# Patient Record
Sex: Male | Born: 1990 | Race: White | Hispanic: No | Marital: Single | Smoking: Never smoker
Health system: Southern US, Community
[De-identification: ages and names within clinical notes are randomized; demographics above are authoritative.]

## PROBLEM LIST (undated history)

## (undated) ENCOUNTER — Emergency Department (HOSPITAL_COMMUNITY): Disposition: A | Discharge status: Home / Self Care | Payer: Managed Care, Other (non HMO)

## (undated) DIAGNOSIS — Z789 Other specified health status: Secondary | ICD-10-CM

## (undated) HISTORY — PX: NO PAST SURGERIES: SHX2092

---

## 2013-05-26 ENCOUNTER — Encounter: Payer: Self-pay | Admitting: Medical

## 2013-05-26 ENCOUNTER — Ambulatory Visit (INDEPENDENT_AMBULATORY_CARE_PROVIDER_SITE_OTHER): Payer: Managed Care, Other (non HMO) | Admitting: Medical

## 2013-05-26 VITALS — BP 92/60 | HR 60 | Temp 98.4°F | Resp 16 | Wt 162.0 lb

## 2013-05-26 DIAGNOSIS — R509 Fever, unspecified: Secondary | ICD-10-CM

## 2013-05-26 DIAGNOSIS — J111 Influenza due to unidentified influenza virus with other respiratory manifestations: Secondary | ICD-10-CM

## 2013-05-26 DIAGNOSIS — J101 Influenza due to other identified influenza virus with other respiratory manifestations: Secondary | ICD-10-CM

## 2013-05-26 DIAGNOSIS — R059 Cough, unspecified: Secondary | ICD-10-CM

## 2013-05-26 DIAGNOSIS — R05 Cough: Secondary | ICD-10-CM

## 2013-05-26 LAB — POC INFLUENZA A&B (BINAX/QUICKVUE)
Influenza A, POC: POSITIVE
Influenza B, POC: NEGATIVE

## 2013-05-26 MED ORDER — OSELTAMIVIR PHOSPHATE 75 MG PO CAPS: 75.0000 mg | ORAL_CAPSULE | Freq: Two times a day (BID) | ORAL | Status: DC

## 2013-05-26 NOTE — Progress Notes (Signed)
Subjective:  Eric Nash is a 23 y.o. male who presents as a new patient for possible influenza.  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  Athletic trainer saw him yesterday and he had low-grade temp and onset of symptoms, advised he come in and get evaluated.  He reports abrupt onset of fever, aches, chills, chest congestion, within last 24 hours.  coughing made him feel like chest exploding.  Started feeling fatigued, hot f/ushed, then alternating chills.  Body aches ,neck and back pains.  Has nasal congestion, sore throat. Feels a little short of breath. Denies nausea, vomiting, diarrhea, rash. Using nothing for symptoms. + sick contacts.  No other aggravating or relieving factors.  No other c/o.  The following portions of the patient's history were reviewed and updated as appropriate: allergies, current medications, past medical history, past social history and problem list.  ROS as in subjective   History reviewed. No pertinent past medical history.   Objective: Vital signs reviewed  General: Ill-appearing, well-developed, well-nourished Skin: Warm, dry HEENT: Nose inflamed and congested, clear conjunctiva, TMs pearly, no sinus tenderness, pharynx with erythema, no exudates Neck: Supple, nontender, shotty cervical adenopathy Heart: Regular rate and rhythm, normal S1, S2, no murmurs Lungs: Clear to auscultation bilaterally, no wheezes, rales, rhonchi Abdomen: Nontender non distended Extremities: Mild generalized tenderness   Assessment and Plan: Encounter Diagnoses  Name Primary?  . Influenza A Yes  . Fever, unspecified   . Cough    FLu A+  Discussed diagnosis of influenza.  prescription given for Tamiflu, discussed risks/benefits of medication.  Discussed supportive care including rest, hydration, OTC Tylenol or NSAID for fever, aches, and malaise.  Discussed period of contagion, self quarantine at home away from others to avoid spread of disease, discussed means of transmission, and possible  complications including pneumonia.  If worse or not improving within the next 4-5 days, then call or return.  Gave note for school, completed his athletic trainer form.

## 2013-05-26 NOTE — Patient Instructions (Signed)
Influenza, Adult Influenza ("the flu") is a viral infection of the respiratory tract. It causes chills, fever, cough, headache, body aches, and sore throat. Influenza in general will make you feel sicker than when you have a common cold. Symptoms of the illness typically last a few days. Cough and fatigue may continue for as long as 7 to 10 days. Influenza is highly contagious. It spreads easily to others in the droplets from coughs and sneezes. People frequently become infected by touching something that was recently contaminated with the virus and then touch their mouth, nose or eyes. This infection is caused by a virus. Symptoms will not be reduced or improved by taking an antibiotic. Antibiotics are medications that kill bacteria, not viruses. DIAGNOSIS  Diagnosis of influenza is often made based on the history and physical examination as well as the presence of influenza reports occurring in your community. Testing can be done if the diagnosis is not certain. TREATMENT  Since influenza is caused by a virus, antibiotics are not helpful. Your caregiver may prescribe antiviral medicines to shorten the illness and lessen the severity. Your caregiver may also recommend influenza vaccination and/or antiviral medicines for your family members in order to prevent the spread of influenza to them. HOME CARE INSTRUCTIONS  DO NOT GIVE ASPIRIN TO PERSONS WITH INFLUENZA WHO ARE UNDER 18 YEARS OF AGE. This could lead to brain and liver damage (Reye's syndrome). Read the label on over-the-counter medicines.   Stay home from work or school if at all possible until most of your symptoms are gone.   Only take over-the-counter or prescription medicines for pain, discomfort, or fever as directed by your caregiver.   Use a cool mist humidifier to increase air moisture. This will make breathing easier.   Rest until your temperature is nearly normal: 98.6 F (37 C). This usually takes 3 to 4 days. Be sure you get  plenty of rest.   Drink at least eight, eight-ounce glasses of fluids per day. Fluids include water, juice, broth, gelatin, or lemonade.   Cover your mouth and nose when coughing or sneezing and wash your hands often to prevent the spread of this virus to other persons.  PREVENTION  Annual influenza vaccination (flu shots) is the best way to avoid getting influenza. An annual flu shot is now routinely recommended for all adults in the [place] SEEK MEDICAL CARE IF:   You develop shortness of breath while resting.   You have a deep cough with production of mucous or chest pain.   You develop nausea (feeling sick to your stomach), vomiting, or diarrhea.  SEEK IMMEDIATE MEDICAL CARE IF:   You have difficulty breathing, become short of breath, or your skin or nails turn bluish.   You develop severe neck pain or stiffness.   You develop a severe headache, facial pain, or earache.   You have a fever.   You develop nausea or vomiting that cannot be controlled.  Document Released: 04/18/2000 Document Revised: 01/01/2011 Document Reviewed: 02/21/2009 [employer] Patient Information 2012 [employer]. 

## 2013-06-29 ENCOUNTER — Encounter: Payer: Self-pay | Admitting: Medical

## 2013-06-29 ENCOUNTER — Ambulatory Visit (INDEPENDENT_AMBULATORY_CARE_PROVIDER_SITE_OTHER): Payer: Managed Care, Other (non HMO) | Admitting: Medical

## 2013-06-29 VITALS — BP 120/70 | HR 68 | Temp 98.1°F | Resp 14 | Wt 165.0 lb

## 2013-06-29 DIAGNOSIS — I889 Nonspecific lymphadenitis, unspecified: Secondary | ICD-10-CM

## 2013-06-29 LAB — CBC WITH DIFFERENTIAL/PLATELET
Basophils Absolute: 0 10*3/uL (ref 0.0–0.1)
Basophils Relative: 1 % (ref 0–1)
Eosinophils Absolute: 0.1 10*3/uL (ref 0.0–0.7)
Eosinophils Relative: 2 % (ref 0–5)
HCT: 42.8 % (ref 39.0–52.0)
Hemoglobin: 15 g/dL (ref 13.0–17.0)
LYMPHS PCT: 36 % (ref 12–46)
Lymphs Abs: 1.4 10*3/uL (ref 0.7–4.0)
MCH: 30.9 pg (ref 26.0–34.0)
MCHC: 35 g/dL (ref 30.0–36.0)
MCV: 88.1 fL (ref 78.0–100.0)
Monocytes Absolute: 0.3 10*3/uL (ref 0.1–1.0)
Monocytes Relative: 8 % (ref 3–12)
Neutro Abs: 2.1 10*3/uL (ref 1.7–7.7)
Neutrophils Relative %: 53 % (ref 43–77)
PLATELETS: 187 10*3/uL (ref 150–400)
RBC: 4.86 MIL/uL (ref 4.22–5.81)
RDW: 14.1 % (ref 11.5–15.5)
WBC: 3.9 10*3/uL — AB (ref 4.0–10.5)

## 2013-06-29 LAB — SEDIMENTATION RATE: Sed Rate: 1 mm/hr (ref 0–16)

## 2013-06-29 NOTE — Patient Instructions (Signed)
Thank you for giving me the opportunity to serve you today.    Your diagnosis today includes: Encounter Diagnosis  Name Primary?  . Lymphadenitis Yes     Specific recommendations today include:  Begin Ibuprofen OTC, 3 tablets 3 times daily for about a week for the inflamed lymph nodes  Consider HPV human papilloma virus vaccine   Return at your convenience for routine STD screening including HIV antibody, RPR test, chlamydia and gonorrhea testing  Follow up: pending labs  I have included other useful information below for your review.  Lymphadenopathy Lymphadenopathy means "disease of the lymph glands." But the term is usually used to describe swollen or enlarged lymph glands, also called lymph nodes. These are the bean-shaped organs found in many locations including the neck, underarm, and groin. Lymph glands are part of the immune system, which fights infections in your body. Lymphadenopathy can occur in just one area of the body, such as the neck, or it can be generalized, with lymph node enlargement in several areas. The nodes found in the neck are the most common sites of lymphadenopathy. CAUSES  When your immune system responds to germs (such as viruses or bacteria ), infection-fighting cells and fluid build up. This causes the glands to grow in size. This is usually not something to worry about. Sometimes, the glands themselves can become infected and inflamed. This is called lymphadenitis. Enlarged lymph nodes can be caused by many diseases:  Bacterial disease, such as strep throat or a skin infection.  Viral disease, such as a common cold.  Other germs, such as lyme disease, tuberculosis, or sexually transmitted diseases.  Cancers, such as lymphoma (cancer of the lymphatic system) or leukemia (cancer of the white blood cells).  Inflammatory diseases such as lupus or rheumatoid arthritis.  Reactions to medications. Many of the diseases above are rare, but important.  This is why you should see your caregiver if you have lymphadenopathy. SYMPTOMS   Swollen, enlarged lumps in the neck, back of the head or other locations.  Tenderness.  Warmth or redness of the skin over the lymph nodes.  Fever. DIAGNOSIS  Enlarged lymph nodes are often near the source of infection. They can help healthcare providers diagnose your illness. For instance:   Swollen lymph nodes around the jaw might be caused by an infection in the mouth.  Enlarged glands in the neck often signal a throat infection.  Lymph nodes that are swollen in more than one area often indicate an illness caused by a virus. Your caregiver most likely will know what is causing your lymphadenopathy after listening to your history and examining you. Blood tests, x-rays or other tests may be needed. If the cause of the enlarged lymph node cannot be found, and it does not go away by itself, then a biopsy may be needed. Your caregiver will discuss this with you. TREATMENT  Treatment for your enlarged lymph nodes will depend on the cause. Many times the nodes will shrink to normal size by themselves, with no treatment. Antibiotics or other medicines may be needed for infection. Only take over-the-counter or prescription medicines for pain, discomfort or fever as directed by your caregiver. HOME CARE INSTRUCTIONS  Swollen lymph glands usually return to normal when the underlying medical condition goes away. If they persist, contact your health-care provider. He/she might prescribe antibiotics or other treatments, depending on the diagnosis. Take any medications exactly as prescribed. Keep any follow-up appointments made to check on the condition of your enlarged nodes.  SEEK MEDICAL CARE IF:   Swelling lasts for more than two weeks.  You have symptoms such as weight loss, night sweats, fatigue or fever that does not go away.  The lymph nodes are hard, seem fixed to the skin or are growing rapidly.  Skin over  the lymph nodes is red and inflamed. This could mean there is an infection. SEEK IMMEDIATE MEDICAL CARE IF:   Fluid starts leaking from the area of the enlarged lymph node.  You develop a fever of 102 F (38.9 C) or greater.  Severe pain develops (not necessarily at the site of a large lymph node).  You develop chest pain or shortness of breath.  You develop worsening abdominal pain. MAKE SURE YOU:   Understand these instructions.  Will watch your condition.  Will get help right away if you are not doing well or get worse. Document Released: 01/29/2008 Document Revised: 07/14/2011 Document Reviewed: 01/29/2008 [facility] Patient Information 2014 [place], [place].

## 2013-06-29 NOTE — Progress Notes (Signed)
    Subjective:    Eric Nash is a 23 y.o. male presenting on 06/29/2013 with swollen lymph nodes  Here for c/o lymph nodes bothering him.  Feels fine in general, but for months now seems to have some tender palpable lymph nodes behind right ear and in neck.  No fever, weight loss, sweats, decreased appetite.   He is a [employer] and otherwise doing well.  Did see me a month ago for influenza.  No mono contacts, no hx/o tick bite or mono.  No GU changes, but does have hx/o 6 sexual partners,thinks he had STD screening at his last physical which was at another doctor's office.  No other aggravating or relieving factors.  No other complaint.  Review of Systems Constitutional: -fever, -chills, -sweats, -unexpected weight change, -fatigue ENT: -runny nose, -ear pain, -sore throat, +sneezing some Cardiology:  -chest pain, -palpitations, -edema Respiratory: -cough, -shortness of breath, -wheezing Gastroenterology: -abdominal pain, -nausea, -vomiting, -diarrhea, -constipation  Hematology: -bleeding or bruising problems Musculoskeletal: -arthralgias, -myalgias, -joint swelling, -back pain Ophthalmology: -vision changes Urology: -dysuria, -difficulty urinating, -hematuria, -urinary frequency, -urgency Neurology: -headache, -weakness, -tingling, -numbness       Objective:     Filed Vitals:   06/29/13 1428  BP: 120/70  Pulse: 68  Temp: 98.1 F (36.7 C)  Resp: 14    General appearance: alert, no distress, WD/WN, lean white male HEENT: normocephalic, sclerae anicteric, TMs pearly, nares patent, no discharge or erythema, pharynx normal Oral cavity: MMM, no lesions Neck: supple, shoddy tender right postauricular nodes, shoddy tender left single posterior nodes and shoddy tender right submandibular node, but no enlarged nodes, no thyromegaly, no masses Heart: RRR, normal S1, S2, no murmurs Lungs: CTA bilaterally, no wheezes, rhonchi, or rales Abdomen: +bs, soft, non tender, non  distended, no masses, no hepatomegaly, no splenomegaly Pulses: 2+ symmetric, upper and lower extremities, normal cap refill Inguinal: right sided palpable tender inguinal nodes, but not enlarged, no erythema     Assessment: Encounter Diagnosis  Name Primary?  . Lymphadenitis Yes     Plan: Discussed causes of lymph nodes tender or swollen.  Discussed common and more serious etiologies.  Currently exam is mostly benign except for lymph nodes discussed in exam above.  No B or red flag symptoms.  I recommended Ibuprofen for lymph nodes tender and inflamed, CBC and sed rate today.   Recommended HPV vaccine and STD screening. He wants to check insurance and copay before doing those tests.  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   Eric Nash was seen today for swollen lymph nodes.  Diagnoses and associated orders for this visit:  Lymphadenitis - CBC with Differential - Sedimentation rate     Return pending labs.

## 2013-09-28 ENCOUNTER — Encounter: Payer: Self-pay | Admitting: Medical

## 2013-09-28 ENCOUNTER — Ambulatory Visit (INDEPENDENT_AMBULATORY_CARE_PROVIDER_SITE_OTHER): Payer: Managed Care, Other (non HMO) | Admitting: Medical

## 2013-09-28 VITALS — BP 112/70 | HR 72 | Temp 97.5°F | Resp 14 | Wt 163.0 lb

## 2013-09-28 DIAGNOSIS — K648 Other hemorrhoids: Secondary | ICD-10-CM

## 2013-09-28 DIAGNOSIS — K921 Melena: Secondary | ICD-10-CM

## 2013-09-28 MED ORDER — HYDROCORTISONE ACETATE 25 MG RE SUPP: 25.0000 mg | Freq: Two times a day (BID) | RECTAL | Status: DC

## 2013-09-28 NOTE — Progress Notes (Signed)
Subjective: Here for c/o blood in stool.  Yesterday when he went to the bath room saw blood in stool.  This is the first time ever seeing this.  Seemed to be bright red blood.  Saw this on the toilet paper and in the toilet bath.  Was mixed in with the formed stool.   Happened a few more times that day and this morning.  Normally has BM 1-2 times daily.  Had 1 recent loose stool 2 days ago.   Denies having frequent bloating, pain or gas.  Denies hemorrhoid.  Not painful to wipe.  No fever.  No naueas, no vomiting.  No recdent worrisome foods.   Was fishing over the weekend at [employer], fishing, handling raw squid, but no sick feeling.  No straining, no constipation.   In general lifts weights with athletics, on the soccer team.  Mother has hx/o diverticulitis.  No prior food allergies or food problems.  No ohter aggravtint or relieving factors. No other c/o.  ROS as in subjective  Objective: Filed Vitals:   09/28/13 1337  BP: 112/70  Pulse: 72  Temp: 97.5 F (36.4 C)  Resp: 14    General appearence: alert, no distress, WD/WN Heart: RRR, normal S1, S2, no murmurs Lungs: CTA bilaterally, no wheezes, rhonchi, or rales Abdomen: +bs, soft, non tender, non distended, no masses, no hepatomegaly, no splenomegaly Pulses: 2+ symmetric, upper and lower extremities, normal cap refill DRE: anus normal appearing, no lesions, nontender, normal anus tone, internal hemorrhoid palpated, occult stool + for blood  Assessment: Encounter Diagnoses  Name Primary?  . Blood in stool Yes  . Internal hemorrhoid    Plan: We discussed his symptoms, concerns, and most likely etiology being internal hemorrhoid which is palpable today.  Discussed prevention, fiber and water intake.  Begin anusol suppository, avoid NSAIDs, and if not resolving within a week, then recheck.  He apparently had colonoscopy few years ago for abdominal pain.  We will try and get records.

## 2013-09-28 NOTE — Patient Instructions (Addendum)
Thank you for giving me the opportunity to serve you today.    Your diagnosis today includes: Encounter Diagnoses  Name Primary?  . Blood in stool Yes  . Internal hemorrhoid     Specific recommendations today include:  Begin Anusol suppository twice daily the next few days  Avoid aspirin, ibuprofen, aleve or other antiinflammatories  Get 15g of fiber daily in the diet  Get 64oz or more of water daily  Return in about 1 month (around 10/29/2013).    I have included other useful information below for your review.  Bloody Stools Bloody stools often mean that there is a problem in the digestive tract. Your caregiver may use the term "melena" to describe black, tarry, and bad smelling stools or "hematochezia" to describe red or maroon-colored stools. Blood seen in the stool can be caused by bleeding anywhere along the intestinal tract.  A black stool usually means that blood is coming from the upper part of the gastrointestinal tract (esophagus, stomach, or small bowel). Passing maroon-colored stools or bright red blood usually means that blood is coming from lower down in the large bowel or the rectum. However, sometimes massive bleeding in the stomach or small intestine can cause bright red bloody stools.  Consuming black licorice, lead, iron pills, medicines containing bismuth subsalicylate, or blueberries can also cause black stools. Your caregiver can test black stools to see if blood is present. It is important that the cause of the bleeding be found. Treatment can then be started, and the problem can be corrected. Rectal bleeding may not be serious, but you should not assume everything is okay until you know the cause.It is very important to follow up with your caregiver or a specialist in gastrointestinal problems. CAUSES  Blood in the stools can come from various underlying causes.Often, the cause is not found during your first visit. Testing is often needed to discover the  cause of bleeding in the gastrointestinal tract. Causes range from simple to serious or even life-threatening.Possible causes include:  Hemorrhoids.These are veins that are full of blood (engorged) in the rectum. They cause pain, inflammation, and may bleed.  Anal fissures.These are areas of painful tearing which may bleed. They are often caused by passing hard stool.  Diverticulosis.These are pouches that form on the colon over time, with age, and may bleed significantly.  Diverticulitis.This is inflammation in areas with diverticulosis. It can cause pain, fever, and bloody stools, although bleeding is rare.  Proctitis and colitis. These are inflamed areas of the rectum or colon. They may cause pain, fever, and bloody stools.  Polyps and cancer. Colon cancer is a leading cause of preventable cancer death.It often starts out as precancerous polyps that can be removed during a colonoscopy, preventing progression into cancer. Sometimes, polyps and cancer may cause rectal bleeding.  Gastritis and ulcers.Bleeding from the upper gastrointestinal tract (near the stomach) may travel through the intestines and produce black, sometimes tarry, often bad smelling stools. In certain cases, if the bleeding is fast enough, the stools may not be black, but red and the condition may be life-threatening. SYMPTOMS  You may have stools that are bright red and bloody, that are normal color with blood on them, or that are dark black and tarry. In some cases, you may only have blood in the toilet bowl. Any of these cases need medical care. You may also have:  Pain at the anus or anywhere in the rectum.  Lightheadedness or feeling faint.  Extreme weakness.  Nausea or vomiting.  Fever. DIAGNOSIS Your caregiver may use the following methods to find the cause of your bleeding:  Taking a medical history. Age is important. Older people tend to develop polyps and cancer more often. If there is anal pain and  a hard, large stool associated with bleeding, a tear of the anus may be the cause. If blood drips into the toilet after a bowel movement, bleeding hemorrhoids may be the problem. The color and frequency of the bleeding are additional considerations. In most cases, the medical history provides clues, but seldom the final answer.  A visual and finger (digital) exam. Your caregiver will inspect the anal area, looking for tears and hemorrhoids. A finger exam can provide information when there is tenderness or a growth inside. In men, the prostate is also examined.  Endoscopy. Several types of small, long scopes (endoscopes) are used to view the colon.  In the office, your caregiver may use a rigid, or more commonly, a flexible viewing sigmoidoscope. This exam is called flexible sigmoidoscopy. It is performed in 5 to 10 minutes.  A more thorough exam is accomplished with a colonoscope. It allows your caregiver to view the entire 5 to 6 foot long colon. Medicine to help you relax (sedative) is usually given for this exam. Frequently, a bleeding lesion may be present beyond the reach of the sigmoidoscope. So, a colonoscopy may be the best exam to start with. Both exams are usually done on an outpatient basis. This means the patient does not stay overnight in the hospital or surgery center.  An upper endoscopy may be needed to examine your stomach. Sedation is used and a flexible endoscope is put in your mouth, down to your stomach.  A barium enema X-ray. This is an X-ray exam. It uses liquid barium inserted by enema into the rectum. This test alone may not identify an actual bleeding point. X-rays highlight abnormal shadows, such as those made by lumps (tumors), diverticulitis, or colitis. TREATMENT  Treatment depends on the cause of your bleeding.   For bleeding from the stomach or colon, the caregiver doing your endoscopy or colonoscopy may be able to stop the bleeding as part of the  procedure.  Inflammation or infection of the colon can be treated with medicines.  Many rectal problems can be treated with creams, suppositories, or warm baths.  Surgery is sometimes needed.  Blood transfusions are sometimes needed if you have lost a lot of blood.  For any bleeding problem, let your caregiver know if you take aspirin or other blood thinners regularly. HOME CARE INSTRUCTIONS   Take any medicines exactly as prescribed.  Keep your stools soft by eating a diet high in fiber. Prunes (1 to 3 a day) work well for many people.  Drink enough water and fluids to keep your urine clear or pale yellow.  Take sitz baths if advised. A sitz bath is when you sit in a bathtub with warm water for 10 to 15 minutes to soak, soothe, and cleanse the rectal area.  If enemas or suppositories are advised, be sure you know how to use them. Tell your caregiver if you have problems with this.  Monitor your bowel movements to look for signs of improvement or worsening. SEEK MEDICAL CARE IF:   You do not improve in the time expected.  Your condition worsens after initial improvement.  You develop any new symptoms. SEEK IMMEDIATE MEDICAL CARE IF:   You develop severe or prolonged rectal bleeding.  You vomit blood.  You feel weak or faint.  You have a fever. MAKE SURE YOU:  Understand these instructions.  Will watch your condition.  Will get help right away if you are not doing well or get worse. Document Released: 04/11/2002 Document Revised: 07/14/2011 Document Reviewed: 09/06/2010 [facility] Patient Information 2014 [place], [place].    Hemorrhoids Hemorrhoids are swollen veins around the rectum or anus. There are two types of hemorrhoids:   Internal hemorrhoids. These occur in the veins just inside the rectum. They may poke through to the outside and become irritated and painful.  External hemorrhoids. These occur in the veins outside the anus and can be felt as a painful  swelling or hard lump near the anus. CAUSES  Pregnancy.   Obesity.   Constipation or diarrhea.   Straining to have a bowel movement.   Sitting for long periods on the toilet.  Heavy lifting or other activity that caused you to strain.  Anal intercourse. SYMPTOMS   Pain.   Anal itching or irritation.   Rectal bleeding.   Fecal leakage.   Anal swelling.   One or more lumps around the anus.  DIAGNOSIS  Your caregiver may be able to diagnose hemorrhoids by visual examination. Other examinations or tests that may be performed include:   Examination of the rectal area with a gloved hand (digital rectal exam).   Examination of anal canal using a small tube (scope).   A blood test if you have lost a significant amount of blood.  A test to look inside the colon (sigmoidoscopy or colonoscopy). TREATMENT Most hemorrhoids can be treated at home. However, if symptoms do not seem to be getting better or if you have a lot of rectal bleeding, your caregiver may perform a procedure to help make the hemorrhoids get smaller or remove them completely. Possible treatments include:   Placing a rubber band at the base of the hemorrhoid to cut off the circulation (rubber band ligation).   Injecting a chemical to shrink the hemorrhoid (sclerotherapy).   Using a tool to burn the hemorrhoid (infrared light therapy).   Surgically removing the hemorrhoid (hemorrhoidectomy).   Stapling the hemorrhoid to block blood flow to the tissue (hemorrhoid stapling).  HOME CARE INSTRUCTIONS   Eat foods with fiber, such as whole grains, beans, nuts, fruits, and vegetables. Ask your doctor about taking products with added fiber in them (fibersupplements).  Increase fluid intake. Drink enough water and fluids to keep your urine clear or pale yellow.   Exercise regularly.   Go to the bathroom when you have the urge to have a bowel movement. Do not wait.   Avoid straining to have  bowel movements.   Keep the anal area dry and clean. Use wet toilet paper or moist towelettes after a bowel movement.   Medicated creams and suppositories may be used or applied as directed.   Only take over-the-counter or prescription medicines as directed by your caregiver.   Take warm sitz baths for 15 20 minutes, 3 4 times a day to ease pain and discomfort.   Place ice packs on the hemorrhoids if they are tender and swollen. Using ice packs between sitz baths may be helpful.   Put ice in a plastic bag.   Place a towel between your skin and the bag.   Leave the ice on for 15 20 minutes, 3 4 times a day.   Do not use a donut-shaped pillow or sit on the toilet for long  periods. This increases blood pooling and pain.  SEEK MEDICAL CARE IF:  You have increasing pain and swelling that is not controlled by treatment or medicine.  You have uncontrolled bleeding.  You have difficulty or you are unable to have a bowel movement.  You have pain or inflammation outside the area of the hemorrhoids. MAKE SURE YOU:  Understand these instructions.  Will watch your condition.  Will get help right away if you are not doing well or get worse. Document Released: 04/18/2000 Document Revised: 04/07/2012 Document Reviewed: 02/24/2012 [facility] Patient Information 2014 [place], [place].

## 2013-10-28 ENCOUNTER — Encounter: Payer: Self-pay | Admitting: Medical

## 2013-10-28 ENCOUNTER — Ambulatory Visit (INDEPENDENT_AMBULATORY_CARE_PROVIDER_SITE_OTHER): Payer: Managed Care, Other (non HMO) | Admitting: Medical

## 2013-10-28 VITALS — BP 120/70 | HR 62 | Temp 98.0°F | Resp 16 | Wt 160.0 lb

## 2013-10-28 DIAGNOSIS — K648 Other hemorrhoids: Secondary | ICD-10-CM

## 2013-10-28 DIAGNOSIS — K921 Melena: Secondary | ICD-10-CM

## 2013-10-28 MED ORDER — HYDROCORTISONE ACETATE 25 MG RE SUPP: 25.0000 mg | Freq: Two times a day (BID) | RECTAL | Status: DC

## 2013-10-28 NOTE — Progress Notes (Signed)
Subjective: Here for recheck on blood in stool, internal hemorrhoid.    Since last visit used the suppository which cleared up the blood in stool within 2 days.  Since then no c/o, no additional blood in stool.  Feels fine . He has been working on fiber and water intake.  No other aggravating or relieving factors. No other c/o.  ROS as in subjective  Objective: Filed Vitals:   10/28/13 0858  BP: 120/70  Pulse: 62  Temp: 98 F (36.7 C)  Resp: 16    General appearance: alert, no distress, WD/WN    Assessment: Encounter Diagnoses  Name Primary?  . Blood in stool Yes  . Internal hemorrhoid    Plan: From last visit resolved.  Discussed need for good water and fiber intake, avoid constpation, avoid heavy lifting, prolonged toileting, straining on the toilet.   Can use Anusol suppository prn.  return prn.

## 2013-11-07 ENCOUNTER — Telehealth: Payer: Self-pay | Admitting: Medical

## 2013-11-07 NOTE — Telephone Encounter (Signed)
Pt called and is out of town in [place]. He is requesting refill on hemorrhoid cream. [place] pharmacy [place] [facility] [place]. [facility] to [facility] [address] [place]. Phone number is [phone]. Pt can be reached at [phone]

## 2013-11-08 ENCOUNTER — Other Ambulatory Visit: Payer: Self-pay | Admitting: Family Medicine

## 2013-11-08 MED ORDER — HYDROCORTISONE ACETATE 25 MG RE SUPP: 25.0000 mg | Freq: Two times a day (BID) | RECTAL | Status: DC

## 2013-11-08 NOTE — Telephone Encounter (Signed)
I called out the Anusol to the pharmacy in [place] and left a message on his machine making him aware of this. CLS

## 2013-11-08 NOTE — Telephone Encounter (Signed)
pls call it out 

## 2013-12-16 ENCOUNTER — Ambulatory Visit (INDEPENDENT_AMBULATORY_CARE_PROVIDER_SITE_OTHER): Payer: No Typology Code available for payment source | Admitting: Internal Medicine

## 2013-12-16 VITALS — BP 120/80 | HR 60 | Temp 97.4°F | Resp 16 | Ht 67.0 in | Wt 158.0 lb

## 2013-12-16 DIAGNOSIS — R519 Headache, unspecified: Secondary | ICD-10-CM

## 2013-12-16 DIAGNOSIS — S0180XA Unspecified open wound of other part of head, initial encounter: Secondary | ICD-10-CM

## 2013-12-16 DIAGNOSIS — R51 Headache: Secondary | ICD-10-CM

## 2013-12-16 NOTE — Progress Notes (Signed)
PROCEDURE: Verbal consent obtained from patient.  Local anesthesia with 1cc Lidocaine 2% with epinephrine.  Wound scrubbed with soap and water and rinsed.  Wound closed with #3 5-0 Prolene (#1 SI, #2 HM) sutures.  Wound cleansed and dressed.

## 2013-12-16 NOTE — Patient Instructions (Signed)

## 2013-12-16 NOTE — Progress Notes (Signed)
   Subjective:    Patient ID: Eric Nash, male    DOB: 01/24/1991, 23 y.o.   MRN: [id number]  Eye Injury  Pertinent negatives include no photophobia.   Chief Complaint  Patient presents with  . Eye Injury    right eye   This chart was scribed for Ellamae Siaobert Braedyn Kauk, MD by Andrew Auaven Small, ED Scribe. This patient was seen in room 12 and the patient's care was started at 12:53 PM.  HPI Comments: Eric Nash is a 23 y.o. male who presents to the [facility] complaining of right eye injury onset 3 hours ago. Pt states he was at soccer practice when "took blow to the face" splitting open his right eye brow. Pt denies LOC, dizziness, light headiness change in eye site, HA, and neck pain.   History reviewed. No pertinent past medical history. History reviewed. No pertinent past surgical history. Prior to Admission medications   Medication Sig Start Date End Date Taking? Authorizing Provider  hydrocortisone (ANUSOL-HC) 25 MG suppository Place 1 suppository (25 mg total) rectally 2 (two) times daily. 11/08/13   Jac Canavanavid S Tysinger, PA-C   Review of Systems  Eyes: Negative for photophobia and visual disturbance.  Musculoskeletal: Negative for neck pain.  Neurological: Negative for dizziness, syncope, light-headedness and headaches.   Objective:   Physical Exam  Nursing note and vitals reviewed. Constitutional: He is oriented to person, place, and time. He appears well-developed and well-nourished. No distress.  HENT:  Head: Normocephalic.  Nose: Nose normal.  Eyes: Conjunctivae and EOM are normal. Pupils are equal, round, and reactive to light.  Neck: Normal range of motion. Neck supple.  Cardiovascular: Normal rate.   Pulmonary/Chest: Effort normal.  Neurological: He is alert and oriented to person, place, and time. He has normal reflexes. No cranial nerve deficit.  Skin: Skin is warm and dry.  Psychiatric: He has a normal mood and affect. His behavior is normal.   BP  120/80  Pulse 60  Temp(Src) 97.4 F (36.3 C) (Oral)  Resp 16  Ht 5\' 7"  (1.702 m)  Wt 158 lb (71.668 kg)  BMI 24.74 kg/m2  SpO2 100% See procedure C Jeffery PAC Assessment & Plan:  Wound, open, face, initial encounter Head pain after injury now resolved  Wound care/suture removal 7 days  I have completed the patient encounter in its entirety as documented by the scribe, with editing by me where necessary. Ladaja Yusupov P. Merla Richesoolittle, M.D.

## 2014-02-02 ENCOUNTER — Encounter: Payer: Self-pay | Admitting: Family Medicine

## 2014-02-02 ENCOUNTER — Ambulatory Visit (INDEPENDENT_AMBULATORY_CARE_PROVIDER_SITE_OTHER): Payer: Managed Care, Other (non HMO) | Admitting: Family Medicine

## 2014-02-02 VITALS — BP 120/70 | HR 55 | Temp 97.8°F | Wt 162.0 lb

## 2014-02-02 DIAGNOSIS — J209 Acute bronchitis, unspecified: Secondary | ICD-10-CM

## 2014-02-02 MED ORDER — AZITHROMYCIN 500 MG PO TABS: 500.0000 mg | ORAL_TABLET | Freq: Every day | ORAL | Status: DC

## 2014-02-02 NOTE — Progress Notes (Signed)
   Subjective:    Patient ID: Eric Nash, male    DOB: [birth year], 23 y.o.   MRN: [id number]  HPI 2 days ago he developed cough and congestion followed the next day by the cough becoming productive. No fever, chills, earache. He does not smoke and has no underlying allergies.. he does play college soccer and has again coming up in the near future.   Review of Systems     Objective:   Physical Exam alert and in no distress. Tympanic membranes and canals are normal. Throat is clear. Tonsils are normal. Neck is supple without adenopathy or thyromegaly. Cardiac exam shows a regular sinus rhythm without murmurs or gallops. Lungs are clear to auscultation.        Assessment & Plan:  Acute bronchitis, unspecified organism - Plan: azithromycin (ZITHROMAX) 500 MG tablet  I explained that the medication will work for at least 7 days and if he has any problems, he will call me.

## 2014-05-18 ENCOUNTER — Other Ambulatory Visit (HOSPITAL_COMMUNITY): Payer: Self-pay | Admitting: Orthopedic Surgery

## 2014-05-18 NOTE — H&P (Signed)
Eric Nash is an 24 y.o. male.   Chief Complaint: Right knee pain and instability HPI: Eric Nash is a 24 year old patient with right knee pain and instability. Injured his knee 4 weeks ago. This was in [place]. He was playing soccer. 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 Plans to play after college. Patient describes right knee pain and instability. Initially he had a contact injury but Playing that 15 minutes later he had a giving way episode and was not able to continue playing. MRI scan from [place] is reviewed. Shows anterior cruciate ligament tear some signal around the medial and lateral around the medial collateral ligament and lateral collateral ligament. Medial meniscus intact lateral meniscus potentially has a small amount of signal at the posterior horn. No family history of DVT or pulmonary embolism. Soccer season starts essentially in August.  No past medical history on file.  No past surgical history on file.  Family History  Problem Relation Age of Onset  . Cancer Paternal Grandfather   . Diabetes Paternal Grandfather   . Heart disease Maternal Grandfather    Social History:  reports that he has never smoked. He does not have any smokeless tobacco history on file. He reports that he does not drink alcohol or use illicit drugs.  Allergies: No Known Allergies  No prescriptions prior to admission    No results found for this or any previous visit (from the past 48 hour(s)). No results found.  Review of Systems  Constitutional: Negative.   HENT: Negative.   Eyes: Negative.   Respiratory: Negative.   Cardiovascular: Negative.   Gastrointestinal: Negative.   Genitourinary: Negative.   Musculoskeletal: Positive for joint pain.  Skin: Negative.   Neurological: Negative.   Endo/Heme/Allergies: Negative.   Psychiatric/Behavioral: Negative.    There were no vitals taken for this visit. Physical Exam  Constitutional: He appears well-developed.  HENT:  Head: Normocephalic.  Eyes:  Pupils are equal, round, and reactive to light.  Neck: Normal range of motion.  Cardiovascular: Normal rate.   Respiratory: Effort normal.  Musculoskeletal: Normal range of motion.  Neurological: He is alert.  Skin: Skin is warm.  Psychiatric: He has a normal mood and affect.    examination right knee demonstrates that the skin is intact trace effusion is present range of motion is excellent lacking slightly less than 5 from full extension on the right he has full flexion collaterals are stable at 0 30 to varus and valgus stress there is no pressure rotatory instability noted anterior cruciate ligament is out skin is intact around the knee pedal pulses palpable   Assessment/Plan Impression is right knee anterior cruciate ligament tear potentially isolated with small possibility of a lateral meniscal tear plan a 7 reconstruction hamstring autograft risk benefits discussed with patient we will and to infection or vessel damage incomplete healing need for more surgery all questions answered plan for surgery Monday.  Eric Nash 05/18/2014, 9:23 PM

## 2014-05-19 ENCOUNTER — Encounter (HOSPITAL_COMMUNITY)
Admission: RE | Admit: 2014-05-19 | Discharge: 2014-05-19 | Disposition: A | Discharge status: Home / Self Care | Payer: Managed Care, Other (non HMO) | Source: Ambulatory Visit | Attending: Orthopedic Surgery | Admitting: Orthopedic Surgery

## 2014-05-19 ENCOUNTER — Encounter (HOSPITAL_COMMUNITY): Payer: Self-pay

## 2014-05-19 DIAGNOSIS — Y939 Activity, unspecified: Secondary | ICD-10-CM | POA: Diagnosis not present

## 2014-05-19 DIAGNOSIS — X58XXXA Exposure to other specified factors, initial encounter: Secondary | ICD-10-CM | POA: Diagnosis not present

## 2014-05-19 DIAGNOSIS — Y929 Unspecified place or not applicable: Secondary | ICD-10-CM | POA: Diagnosis not present

## 2014-05-19 DIAGNOSIS — M25561 Pain in right knee: Secondary | ICD-10-CM | POA: Diagnosis present

## 2014-05-19 DIAGNOSIS — S83511A Sprain of anterior cruciate ligament of right knee, initial encounter: Secondary | ICD-10-CM | POA: Diagnosis not present

## 2014-05-19 HISTORY — DX: Other specified health status: Z78.9

## 2014-05-19 LAB — CBC
HCT: 45.5 % (ref 39.0–52.0)
HEMOGLOBIN: 15.7 g/dL (ref 13.0–17.0)
MCH: 30.6 pg (ref 26.0–34.0)
MCHC: 34.5 g/dL (ref 30.0–36.0)
MCV: 88.7 fL (ref 78.0–100.0)
Platelets: 197 10*3/uL (ref 150–400)
RBC: 5.13 MIL/uL (ref 4.22–5.81)
RDW: 12.9 % (ref 11.5–15.5)
WBC: 4.6 10*3/uL (ref 4.0–10.5)

## 2014-05-19 LAB — BASIC METABOLIC PANEL
ANION GAP: 7 (ref 5–15)
BUN: 9 mg/dL (ref 6–23)
CALCIUM: 9.4 mg/dL (ref 8.4–10.5)
CO2: 29 mmol/L (ref 19–32)
Chloride: 102 mEq/L (ref 96–112)
Creatinine, Ser: 0.97 mg/dL (ref 0.50–1.35)
GLUCOSE: 96 mg/dL (ref 70–99)
Potassium: 4 mmol/L (ref 3.5–5.1)
Sodium: 138 mmol/L (ref 135–145)

## 2014-05-21 MED ORDER — CEFAZOLIN SODIUM-DEXTROSE 2-3 GM-% IV SOLR
2.0000 g | INTRAVENOUS | Status: AC
  Filled 2014-05-21: qty 50

## 2014-05-21 MED ORDER — CHLORHEXIDINE GLUCONATE 4 % EX LIQD
60.0000 mL | Freq: Once | CUTANEOUS | Status: DC
  Filled 2014-05-21: qty 60

## 2014-05-22 ENCOUNTER — Ambulatory Visit (HOSPITAL_COMMUNITY): Payer: Managed Care, Other (non HMO) | Admitting: Anesthesiology

## 2014-05-22 ENCOUNTER — Ambulatory Visit (HOSPITAL_COMMUNITY): Payer: Managed Care, Other (non HMO)

## 2014-05-22 ENCOUNTER — Encounter (HOSPITAL_COMMUNITY)
Admission: RE | Disposition: A | Discharge status: Home / Self Care | Payer: Self-pay | Source: Ambulatory Visit | Attending: Orthopedic Surgery

## 2014-05-22 ENCOUNTER — Encounter (HOSPITAL_COMMUNITY): Payer: Self-pay | Admitting: *Deleted

## 2014-05-22 ENCOUNTER — Ambulatory Visit (HOSPITAL_COMMUNITY)
Admission: RE | Admit: 2014-05-22 | Discharge: 2014-05-22 | Disposition: A | Discharge status: Home / Self Care | Payer: Managed Care, Other (non HMO) | Source: Ambulatory Visit | Attending: Orthopedic Surgery | Admitting: Orthopedic Surgery

## 2014-05-22 DIAGNOSIS — Y929 Unspecified place or not applicable: Secondary | ICD-10-CM | POA: Insufficient documentation

## 2014-05-22 DIAGNOSIS — X58XXXA Exposure to other specified factors, initial encounter: Secondary | ICD-10-CM | POA: Insufficient documentation

## 2014-05-22 DIAGNOSIS — S83511A Sprain of anterior cruciate ligament of right knee, initial encounter: Secondary | ICD-10-CM | POA: Insufficient documentation

## 2014-05-22 DIAGNOSIS — S83519A Sprain of anterior cruciate ligament of unspecified knee, initial encounter: Secondary | ICD-10-CM

## 2014-05-22 DIAGNOSIS — Y939 Activity, unspecified: Secondary | ICD-10-CM | POA: Insufficient documentation

## 2014-05-22 HISTORY — PX: ANTERIOR CRUCIATE LIGAMENT REPAIR: SHX115

## 2014-05-22 SURGERY — RECONSTRUCTION, KNEE, ACL, USING HAMSTRING GRAFT
Anesthesia: Regional | Site: Knee | Laterality: Right

## 2014-05-22 MED ORDER — MORPHINE SULFATE 2 MG/ML IJ SOLN
INTRAMUSCULAR | Status: AC
  Filled 2014-05-22: qty 2

## 2014-05-22 MED ORDER — OXYCODONE-ACETAMINOPHEN 10-325 MG PO TABS: 1.0000 | ORAL_TABLET | Freq: Four times a day (QID) | ORAL | Status: DC | PRN

## 2014-05-22 MED ORDER — FENTANYL CITRATE 0.05 MG/ML IJ SOLN
INTRAMUSCULAR | Status: AC
  Filled 2014-05-22: qty 5

## 2014-05-22 MED ORDER — PROMETHAZINE HCL 25 MG/ML IJ SOLN
6.2500 mg | INTRAMUSCULAR | Status: DC | PRN
  Administered 2014-05-22: 6.25 mg via INTRAVENOUS

## 2014-05-22 MED ORDER — LIDOCAINE HCL (CARDIAC) 20 MG/ML IV SOLN
INTRAVENOUS | Status: DC | PRN
  Administered 2014-05-22: 100 mg via INTRAVENOUS

## 2014-05-22 MED ORDER — PHENYLEPHRINE 40 MCG/ML (10ML) SYRINGE FOR IV PUSH (FOR BLOOD PRESSURE SUPPORT)
PREFILLED_SYRINGE | INTRAVENOUS | Status: AC
  Filled 2014-05-22: qty 10

## 2014-05-22 MED ORDER — PROPOFOL 10 MG/ML IV BOLUS
INTRAVENOUS | Status: DC | PRN
  Administered 2014-05-22: 200 mg via INTRAVENOUS

## 2014-05-22 MED ORDER — HYDROMORPHONE HCL 1 MG/ML IJ SOLN
INTRAMUSCULAR | Status: AC
  Filled 2014-05-22: qty 1

## 2014-05-22 MED ORDER — SODIUM CHLORIDE 0.9 % IJ SOLN
INTRAMUSCULAR | Status: AC
  Filled 2014-05-22: qty 10

## 2014-05-22 MED ORDER — FENTANYL CITRATE 0.05 MG/ML IJ SOLN
INTRAMUSCULAR | Status: DC | PRN
  Administered 2014-05-22: 25 ug via INTRAVENOUS
  Administered 2014-05-22: 50 ug via INTRAVENOUS
  Administered 2014-05-22: 25 ug via INTRAVENOUS
  Administered 2014-05-22 (×2): 50 ug via INTRAVENOUS

## 2014-05-22 MED ORDER — CEFAZOLIN SODIUM-DEXTROSE 2-3 GM-% IV SOLR
INTRAVENOUS | Status: DC | PRN
  Administered 2014-05-22: 2 g via INTRAVENOUS

## 2014-05-22 MED ORDER — HYDROMORPHONE HCL 1 MG/ML IJ SOLN
0.2500 mg | INTRAMUSCULAR | Status: DC | PRN
  Administered 2014-05-22 (×3): 0.5 mg via INTRAVENOUS

## 2014-05-22 MED ORDER — MEPERIDINE HCL 25 MG/ML IJ SOLN: 6.2500 mg | INTRAMUSCULAR | Status: DC | PRN

## 2014-05-22 MED ORDER — LACTATED RINGERS IV SOLN
INTRAVENOUS | Status: DC | PRN
  Administered 2014-05-22 (×2): via INTRAVENOUS

## 2014-05-22 MED ORDER — ROCURONIUM BROMIDE 50 MG/5ML IV SOLN
INTRAVENOUS | Status: AC
  Filled 2014-05-22: qty 1

## 2014-05-22 MED ORDER — BUPIVACAINE HCL (PF) 0.25 % IJ SOLN
INTRAMUSCULAR | Status: AC
  Filled 2014-05-22: qty 30

## 2014-05-22 MED ORDER — SODIUM CHLORIDE 0.9 % IR SOLN
Status: DC | PRN
  Administered 2014-05-22 (×6): 3000 mL

## 2014-05-22 MED ORDER — MIDAZOLAM HCL 5 MG/5ML IJ SOLN
INTRAMUSCULAR | Status: DC | PRN
  Administered 2014-05-22: 2 mg via INTRAVENOUS

## 2014-05-22 MED ORDER — MORPHINE SULFATE 4 MG/ML IJ SOLN
INTRAMUSCULAR | Status: DC | PRN
  Administered 2014-05-22: 8 mg

## 2014-05-22 MED ORDER — PHENYLEPHRINE HCL 10 MG/ML IJ SOLN
INTRAMUSCULAR | Status: DC | PRN
  Administered 2014-05-22 (×2): 40 ug via INTRAVENOUS

## 2014-05-22 MED ORDER — BUPIVACAINE HCL (PF) 0.25 % IJ SOLN
INTRAMUSCULAR | Status: DC | PRN
  Administered 2014-05-22: 10 mL

## 2014-05-22 MED ORDER — 0.9 % SODIUM CHLORIDE (POUR BTL) OPTIME
TOPICAL | Status: DC | PRN
  Administered 2014-05-22: 1000 mL

## 2014-05-22 MED ORDER — METHOCARBAMOL 500 MG PO TABS: 500.0000 mg | ORAL_TABLET | Freq: Four times a day (QID) | ORAL | Status: DC

## 2014-05-22 MED ORDER — SUCCINYLCHOLINE CHLORIDE 20 MG/ML IJ SOLN
INTRAMUSCULAR | Status: AC
  Filled 2014-05-22: qty 1

## 2014-05-22 MED ORDER — BUPIVACAINE HCL (PF) 0.25 % IJ SOLN
INTRAMUSCULAR | Status: AC
  Filled 2014-05-22: qty 10

## 2014-05-22 MED ORDER — EPHEDRINE SULFATE 50 MG/ML IJ SOLN
INTRAMUSCULAR | Status: AC
  Filled 2014-05-22: qty 1

## 2014-05-22 MED ORDER — ONDANSETRON HCL 4 MG/2ML IJ SOLN
INTRAMUSCULAR | Status: DC | PRN
  Administered 2014-05-22: 4 mg via INTRAVENOUS

## 2014-05-22 MED ORDER — PROMETHAZINE HCL 25 MG/ML IJ SOLN
INTRAMUSCULAR | Status: AC
  Filled 2014-05-22: qty 1

## 2014-05-22 MED ORDER — CLONIDINE HCL (ANALGESIA) 100 MCG/ML EP SOLN
150.0000 ug | Freq: Once | EPIDURAL | Status: AC
  Administered 2014-05-22: 1 mL via INTRA_ARTICULAR
  Filled 2014-05-22: qty 1.5

## 2014-05-22 MED ORDER — ASPIRIN EC 325 MG PO TBEC: 325.0000 mg | DELAYED_RELEASE_TABLET | Freq: Every day | ORAL | Status: DC

## 2014-05-22 MED ORDER — MIDAZOLAM HCL 2 MG/2ML IJ SOLN
INTRAMUSCULAR | Status: AC
  Filled 2014-05-22: qty 2

## 2014-05-22 MED ORDER — LIDOCAINE HCL (CARDIAC) 20 MG/ML IV SOLN
INTRAVENOUS | Status: AC
  Filled 2014-05-22: qty 5

## 2014-05-22 MED ORDER — PROPOFOL 10 MG/ML IV BOLUS
INTRAVENOUS | Status: AC
  Filled 2014-05-22: qty 20

## 2014-05-22 MED ORDER — ONDANSETRON HCL 4 MG/2ML IJ SOLN
INTRAMUSCULAR | Status: AC
  Filled 2014-05-22: qty 2

## 2014-05-22 SURGICAL SUPPLY — 94 items
ANCHOR BUTTON TIGHTROPE ACL RT (Orthopedic Implant) ×6 IMPLANT
BANDAGE ESMARK 6X9 LF (GAUZE/BANDAGES/DRESSINGS) ×1 IMPLANT
BLADE CUDA 5.5 (BLADE) IMPLANT
BLADE CUTTER GATOR 3.5 (BLADE) ×3 IMPLANT
BLADE GREAT WHITE 4.2 (BLADE) ×4 IMPLANT
BLADE GREAT WHITE 4.2MM (BLADE) ×2
BLADE SURG 10 STRL SS (BLADE) ×3 IMPLANT
BLADE SURG 15 STRL LF DISP TIS (BLADE) ×2 IMPLANT
BLADE SURG 15 STRL SS (BLADE) ×4
BNDG ELASTIC 6X15 VLCR STRL LF (GAUZE/BANDAGES/DRESSINGS) ×3 IMPLANT
BNDG ESMARK 6X9 LF (GAUZE/BANDAGES/DRESSINGS) ×3
BONE MATRIX DEMINERALIZED 1CC (Bone Implant) ×9 IMPLANT
BUR OVAL 6.0 (BURR) ×6 IMPLANT
CLOSURE WOUND 1/2 X4 (GAUZE/BANDAGES/DRESSINGS) ×1
COVER SURGICAL LIGHT HANDLE (MISCELLANEOUS) ×6 IMPLANT
CUFF TOURNIQUET SINGLE 34IN LL (TOURNIQUET CUFF) ×3 IMPLANT
CUFF TOURNIQUET SINGLE 44IN (TOURNIQUET CUFF) IMPLANT
CUTTER FLIP II 9.5MM (INSTRUMENTS) ×6 IMPLANT
DECANTER SPIKE VIAL GLASS SM (MISCELLANEOUS) ×3 IMPLANT
DRAPE ARTHROSCOPY W/POUCH 114 (DRAPES) ×3 IMPLANT
DRAPE C-ARM 42X72 X-RAY (DRAPES) ×3 IMPLANT
DRAPE INCISE IOBAN 66X45 STRL (DRAPES) ×6 IMPLANT
DRAPE ORTHO SPLIT 77X108 STRL (DRAPES) ×2
DRAPE PROXIMA HALF (DRAPES) ×3 IMPLANT
DRAPE SURG ORHT 6 SPLT 77X108 (DRAPES) ×1 IMPLANT
DRAPE U-SHAPE 47X51 STRL (DRAPES) ×3 IMPLANT
DRSG PAD ABDOMINAL 8X10 ST (GAUZE/BANDAGES/DRESSINGS) ×3 IMPLANT
DURAPREP 26ML APPLICATOR (WOUND CARE) ×9 IMPLANT
ELECT CAUTERY BLADE 6.4 (BLADE) ×3 IMPLANT
ELECT REM PT RETURN 9FT ADLT (ELECTROSURGICAL) ×3
ELECTRODE REM PT RTRN 9FT ADLT (ELECTROSURGICAL) ×1 IMPLANT
FIBERSTICK 2 (SUTURE) ×3 IMPLANT
GAUZE SPONGE 4X4 12PLY STRL (GAUZE/BANDAGES/DRESSINGS) ×6 IMPLANT
GAUZE XEROFORM 1X8 LF (GAUZE/BANDAGES/DRESSINGS) ×3 IMPLANT
GLOVE BIO SURGEON STRL SZ7 (GLOVE) ×6 IMPLANT
GLOVE BIOGEL PI IND STRL 6.5 (GLOVE) ×3 IMPLANT
GLOVE BIOGEL PI IND STRL 7.5 (GLOVE) ×2 IMPLANT
GLOVE BIOGEL PI IND STRL 8 (GLOVE) ×2 IMPLANT
GLOVE BIOGEL PI INDICATOR 6.5 (GLOVE) ×6
GLOVE BIOGEL PI INDICATOR 7.5 (GLOVE) ×4
GLOVE BIOGEL PI INDICATOR 8 (GLOVE) ×4
GLOVE ORTHO TXT STRL SZ7.5 (GLOVE) ×6 IMPLANT
GLOVE SURG ORTHO 8.0 STRL STRW (GLOVE) ×6 IMPLANT
GOWN SPEC L3 XXLG W/TWL (GOWN DISPOSABLE) ×3 IMPLANT
GOWN STRL REUS W/ TWL LRG LVL3 (GOWN DISPOSABLE) ×2 IMPLANT
GOWN STRL REUS W/TWL LRG LVL3 (GOWN DISPOSABLE) ×4
IMMOBILIZER KNEE 22 (SOFTGOODS) ×3 IMPLANT
IMMOBILIZER KNEE 22 UNIV (SOFTGOODS) ×3 IMPLANT
KIT BASIN OR (CUSTOM PROCEDURE TRAY) ×3 IMPLANT
KIT BIO-TENODESIS 3X8 DISP (MISCELLANEOUS) ×2
KIT BIOCARTILAGE DEL W/SYRINGE (KITS) ×3 IMPLANT
KIT INSRT BABSR STRL DISP BTN (MISCELLANEOUS) ×1 IMPLANT
KIT ROOM TURNOVER OR (KITS) ×3 IMPLANT
MANIFOLD NEPTUNE II (INSTRUMENTS) ×3 IMPLANT
NEEDLE 18GX1X1/2 (RX/OR ONLY) (NEEDLE) ×3 IMPLANT
NS IRRIG 1000ML POUR BTL (IV SOLUTION) ×3 IMPLANT
PACK ARTHROSCOPY DSU (CUSTOM PROCEDURE TRAY) ×3 IMPLANT
PAD ABD 8X10 STRL (GAUZE/BANDAGES/DRESSINGS) ×3 IMPLANT
PAD ARMBOARD 7.5X6 YLW CONV (MISCELLANEOUS) ×6 IMPLANT
PAD CAST 4YDX4 CTTN HI CHSV (CAST SUPPLIES) ×1 IMPLANT
PADDING CAST COTTON 4X4 STRL (CAST SUPPLIES) ×2
PADDING CAST COTTON 6X4 STRL (CAST SUPPLIES) ×3 IMPLANT
PASSER SUT SWANSON 36MM LOOP (INSTRUMENTS) ×3 IMPLANT
PENCIL BUTTON HOLSTER BLD 10FT (ELECTRODE) ×3 IMPLANT
PIN DRILL ACL TIGHTROPE 4MM (PIN) ×6 IMPLANT
SET ARTHROSCOPY TUBING (MISCELLANEOUS) ×2
SET ARTHROSCOPY TUBING LN (MISCELLANEOUS) ×1 IMPLANT
SPONGE GAUZE 4X4 12PLY STER LF (GAUZE/BANDAGES/DRESSINGS) ×3 IMPLANT
SPONGE LAP 4X18 X RAY DECT (DISPOSABLE) ×3 IMPLANT
SPONGE SCRUB IODOPHOR (GAUZE/BANDAGES/DRESSINGS) ×3 IMPLANT
STRIP CLOSURE SKIN 1/2X4 (GAUZE/BANDAGES/DRESSINGS) ×2 IMPLANT
SUCTION FRAZIER TIP 10 FR DISP (SUCTIONS) ×3 IMPLANT
SUT 2 FIBERLOOP 20 STRT BLUE (SUTURE) ×3
SUT ETHILON 3 0 PS 1 (SUTURE) ×12 IMPLANT
SUT FIBERWIRE #2 38 T-5 BLUE (SUTURE) ×3
SUT PROLENE 3 0 PS 2 (SUTURE) ×3 IMPLANT
SUT PROLENE 3 0 SH 48 (SUTURE) ×3 IMPLANT
SUT VIC AB 0 CT1 27 (SUTURE) ×4
SUT VIC AB 0 CT1 27XBRD ANBCTR (SUTURE) ×2 IMPLANT
SUT VIC AB 2-0 CT1 27 (SUTURE) ×2
SUT VIC AB 2-0 CT1 TAPERPNT 27 (SUTURE) ×1 IMPLANT
SUT VICRYL 0 UR6 27IN ABS (SUTURE) ×6 IMPLANT
SUTURE 2 FIBERLOOP 20 STRT BLU (SUTURE) ×1 IMPLANT
SUTURE FIBERWR #2 38 T-5 BLUE (SUTURE) ×1 IMPLANT
SUTURE TIGERSTICK 2 TIGERWIR 2 (MISCELLANEOUS) ×1 IMPLANT
SYR 30ML LL (SYRINGE) ×3 IMPLANT
SYR BULB IRRIGATION 50ML (SYRINGE) ×3 IMPLANT
SYR TB 1ML LUER SLIP (SYRINGE) ×3 IMPLANT
TIGERSTICK 2 TIGERWIRE 2 (MISCELLANEOUS) ×3
TOWEL OR 17X24 6PK STRL BLUE (TOWEL DISPOSABLE) ×3 IMPLANT
TOWEL OR 17X26 10 PK STRL BLUE (TOWEL DISPOSABLE) ×3 IMPLANT
UNDERPAD 30X30 INCONTINENT (UNDERPADS AND DIAPERS) ×3 IMPLANT
WAND HAND CNTRL MULTIVAC 90 (MISCELLANEOUS) ×6 IMPLANT
WATER STERILE IRR 1000ML POUR (IV SOLUTION) ×3 IMPLANT

## 2014-05-22 NOTE — Brief Op Note (Signed)
05/22/2014  10:47 AM  PATIENT:  Eric Nash  24 y.o. male  PRE-OPERATIVE DIAGNOSIS:  RIGHT KNEE ANTERIOR CRUCIATE LIGAMENT TEAR  POST-OPERATIVE DIAGNOSIS:  right knee anterior cruciate ligament tear  PROCEDURE:  Procedure(s): RECONSTRUCTION ANTERIOR CRUCIATE LIGAMENT  WITH HAMSTRING GRAFT  SURGEON:  Surgeon(s): Cammy CopaGregory Scott Dean, MD  ASSISTANT: carla bethune rnfa  ANESTHESIA:   general  EBL: 22 ml    Total I/O In: 1000 [I.V.:1000] Out: -   BLOOD ADMINISTERED: none  DRAINS: none   LOCAL MEDICATIONS USED:  Marcaine mso4 clonidine  SPECIMEN:  No Specimen  COUNTS:  YES  TOURNIQUET:  * Missing tourniquet times found for documented tourniquets in log:  [id number] *  DICTATION: .Other Dictation: Dictation Number [id number]  PLAN OF CARE: Discharge to home after PACU  PATIENT DISPOSITION:  PACU - hemodynamically stable

## 2014-05-22 NOTE — Anesthesia Preprocedure Evaluation (Addendum)
Anesthesia Evaluation  Patient identified by MRN, date of birth, ID band Patient awake    Reviewed: Allergy & Precautions, NPO status , Patient's Chart, lab work & pertinent test results  Airway Mallampati: II  TM Distance: >3 FB Neck ROM: Full    Dental no notable dental hx. (+) Teeth Intact, Dental Advisory Given   Pulmonary neg pulmonary ROS,  breath sounds clear to auscultation  Pulmonary exam normal       Cardiovascular negative cardio ROS  Rhythm:Regular Rate:Normal     Neuro/Psych negative neurological ROS  negative psych ROS   GI/Hepatic negative GI ROS, Neg liver ROS,   Endo/Other  negative endocrine ROS  Renal/GU negative Renal ROS     Musculoskeletal negative musculoskeletal ROS (+)   Abdominal   Peds  Hematology negative hematology ROS (+)   Anesthesia Other Findings   Reproductive/Obstetrics                            Anesthesia Physical Anesthesia Plan  ASA: I  Anesthesia Plan: General and Regional   Post-op Pain Management:    Induction: Intravenous  Airway Management Planned: LMA  Additional Equipment: None  Intra-op Plan:   Post-operative Plan: Extubation in OR  Informed Consent: I have reviewed the patients History and Physical, chart, labs and discussed the procedure including the risks, benefits and alternatives for the proposed anesthesia with the patient or authorized representative who has indicated his/her understanding and acceptance.   Dental advisory given  Plan Discussed with: CRNA, Anesthesiologist and Surgeon  Anesthesia Plan Comments:        Anesthesia Quick Evaluation

## 2014-05-22 NOTE — Anesthesia Procedure Notes (Addendum)
Procedure Name: LMA Insertion Date/Time: 05/22/2014 7:42 AM Performed by: Rogelia BogaMUELLER, THOMAS P Pre-anesthesia Checklist: Patient identified, Emergency Drugs available, Suction available, Patient being monitored and Timeout performed Patient Re-evaluated:Patient Re-evaluated prior to inductionOxygen Delivery Method: Circle system utilized Preoxygenation: Pre-oxygenation with 100% oxygen Intubation Type: IV induction Ventilation: Mask ventilation without difficulty LMA: LMA inserted LMA Size: 4.0 Tube type: Oral Number of attempts: 1 Placement Confirmation: positive ETCO2 and breath sounds checked- equal and bilateral Tube secured with: Tape Dental Injury: Teeth and Oropharynx as per pre-operative assessment    Anesthesia Regional Block:  Femoral nerve block  Pre-Anesthetic Checklist: ,, timeout performed, Correct Patient, Correct Site, Correct Laterality, Correct Procedure, Correct Position, site marked, Risks and benefits discussed,  Surgical consent,  Pre-op evaluation,  At surgeon's request and post-op pain management  Laterality: Right  Prep: chloraprep       Needles:  Injection technique: Single-shot  Needle Type: Stimulator Needle - 80      Needle Gauge: 22 and 22 G  Needle insertion depth: 6 cm   Additional Needles:  Procedures: ultrasound guided (picture in chart) and nerve stimulator  Motor weakness within 5 minutes. Femoral nerve block  Nerve Stimulator or Paresthesia:  Response: Twitch elicited, 0.5 mA,   Additional Responses:   Narrative:  Start time: 05/22/2014 7:18 AM End time: 05/22/2014 7:23 AM Injection made incrementally with aspirations every 5 mL.  Performed by: Personally   Additional Notes: BP cuff, EKG monitors applied. Sedation begun. US used for location of nerve and under direct US guidance anesthetic meds injected incrementally, slowly , and after neg aspirations. Tolerated well.

## 2014-05-22 NOTE — Discharge Instructions (Signed)

## 2014-05-22 NOTE — Anesthesia Postprocedure Evaluation (Signed)
Anesthesia Post Note  Patient: Eric Nash  Procedure(s) Performed: Procedure(s) (LRB): RECONSTRUCTION ANTERIOR CRUCIATE LIGAMENT  WITH HAMSTRING GRAFT (Right)  Anesthesia type: General  Patient location: PACU  Post pain: Pain level controlled  Post assessment: Post-op Vital signs reviewed  Last Vitals: BP 129/63 mmHg  Pulse 52  Temp(Src) 36.2 C  Resp 15  Ht 5\' 8"  (1.727 m)  Wt 165 lb (74.844 kg)  BMI 25.09 kg/m2  SpO2 98%  Post vital signs: Reviewed  Level of consciousness: sedated  Complications: No apparent anesthesia complications

## 2014-05-22 NOTE — Interval H&P Note (Signed)
History and Physical Interval Note:  05/22/2014 7:23 AM  Eric Nash  has presented today for surgery, with the diagnosis of RIGHT KNEE ANTERIOR CRUCIATE LIGAMENT TEAR  The various methods of treatment have been discussed with the patient and family. After consideration of risks, benefits and other options for treatment, the patient has consented to  Procedure(s) with comments: RECONSTRUCTION ANTERIOR CRUCIATE LIGAMENT (ACL) WITH HAMSTRING GRAFT, POSSIBLE MENISCAL REPAIR VS DEBRIDEMENT (Right) - RIGHT KNEE ACL RECONSTRUCTION WITH HAMSTRING AUTOGRAFT, POSSIBLE MENISCAL REPAIR VS DEBRIDEMENT. as a surgical intervention .  The patient's history has been reviewed, patient examined, no change in status, stable for surgery.  I have reviewed the patient's chart and labs.  Questions were answered to the patient's satisfaction.     Satonya Lux SCOTT

## 2014-05-22 NOTE — Transfer of Care (Signed)
Immediate Anesthesia Transfer of Care Note  Patient: Eric Nash  Procedure(s) Performed: Procedure(s): RECONSTRUCTION ANTERIOR CRUCIATE LIGAMENT  WITH HAMSTRING GRAFT (Right)  Patient Location: PACU  Anesthesia Type:GA combined with regional for post-op pain  Level of Consciousness: awake, alert , oriented and patient cooperative  Airway & Oxygen Therapy: Patient Spontanous Breathing and Patient connected to nasal cannula oxygen  Post-op Assessment: Report given to PACU RN, Post -op Vital signs reviewed and stable and Patient moving all extremities X 4  Post vital signs: Reviewed and stable  Complications: No apparent anesthesia complications

## 2014-05-23 NOTE — Progress Notes (Signed)
Patient advised to call MD for more pain medication or a new prescription for pain medication since the prescribed medication was not helping pain very much

## 2014-05-23 NOTE — Op Note (Signed)
[name]:  Eric, Nash              ACCOUNT NO.:  [id number]  MEDICAL RECORD NO.:  [id number]  LOCATION:                                 FACILITY:  PHYSICIAN:  Burnard BuntingG. Scott Lorea Kupfer, M.D.    DATE OF BIRTH:  1991-02-15  DATE OF PROCEDURE:  05/22/2014 DATE OF DISCHARGE:  05/22/2014                              OPERATIVE REPORT   PREOPERATIVE DIAGNOSIS:  Right knee anterior cruciate ligament tear.  POSTOPERATIVE DIAGNOSIS:  Right knee anterior cruciate ligament tear.  PROCEDURE:  Right knee anterior cruciate ligament reconstruction hamstring autograft, 9.5 mm graft.  SURGEON:  Burnard BuntingG. Scott Anay Walter, M.D.  ASSISTANT:  Patrick Jupiterarla Bethune, RNFA.  INDICATIONS:  Eric Nash is a patient, who injured his right knee a month ago, who  presents for operative management after explanation, risks and benefits.  OPERATIVE FINDINGS: 1. Examination under anesthesia:  Range of motion lacked about 5     degrees of full extension, had full flexion, had good stability to     varus valgus stress at 0-30 degrees.  ACL out, PCL intact.  No     posterolateral rotatory instability is noted. 2. Diagnostic and operative arthroscopy: 3. Intact patellofemoral compartment. 4. No loose bodies or medial lateral gutter. 5. Intact medial meniscus and articular cartilage and intact medial     compartment and articular cartilage and meniscus. 6. Intact lateral compartment, articular cartilage, and meniscus. 7. Torn ACL and intact PCL.  PROCEDURE IN DETAIL:  The patient was brought to the operating room, where general endotracheal anesthesia was induced.  Preoperative antibiotics administered.  Time-out was called.  Right leg was prescrubbed with alcohol and Betadine, allowed to air dry, prepped with DuraPrep solution and draped in sterile manner including the foot. Ioban used to cover the operative field.  Leg was elevated and exsanguinated with Esmarch wrap.  Tourniquet was inflated.  Total tourniquet time was 44 minutes at  250  mmHg.  An incision was made over the pes tendons slightly medial and distal to the tibial tubercle.  Skin and subcutaneous tissues were sharply divided, and the patient had an excellent semitendinosus tendon, which was harvested.  Prepared on the back table __________ to size 9.5 mm using dual EndoButton technique. Concurrently, the anterior and inferolateral and anterior-inferior medial portal was established.  Diagnostic arthroscopy demonstrated intact medial compartment, articular cartilage, and meniscus, lateral compartment, articular cartilage, and meniscus, and patellofemoral cartilage.  ACL was torn.  ACL stump debrided.  Notchplasty performed. The graft measured 9.5 mm.  A 9.5 mm flip cutter was placed about 9 o'clock position on the lateral femoral condyle.  The initial flip cutter did have a mechanical defect and broke.  This was removed from the knee joint.  Fluoroscopy demonstrated no remaining metal components. A second flip cutter was utilized in the 9 o'clock position, 9.5 mm tunnel with excellent position and similar tunnel was drilled on the tibial side and a native ACL footprint.  At this time, both tunnels were filled with StimuBlast graft passed and secured with the flip on the femoral side and tibial side.  Excellent stable fixation was achieved. The graft was tensioned in full extension.  At this time, thorough irrigation  was performed.  Fluoroscopy was used to confirm correct flipping of the button.  The knee was taken through a range of motion and found to have excellent stability and excellent range of motion. Thorough irrigation was performed in the knee joint in the harvest incision and the lateral incision.  Incisions and portals were closed using 2-0 Vicryl and 3-0 nylon.  Harvest site closed using 0 Vicryl, 2-0 Vicryl, and 3-0 Prolene and lateral site closed using a 2-0 Vicryl and 3- 0 nylon.  Solution of Marcaine and morphine was finally injected in  the knee and knee immobilizer placed.  The patient tolerated the procedure well without immediate complication and transferred to recovery room in stable condition.     Burnard Bunting, M.D.     GSD/MEDQ  D:  05/22/2014  T:  05/23/2014  Job:  [id number]

## 2014-05-24 ENCOUNTER — Encounter (HOSPITAL_COMMUNITY): Payer: Self-pay | Admitting: Orthopedic Surgery

## 2015-06-15 ENCOUNTER — Ambulatory Visit (INDEPENDENT_AMBULATORY_CARE_PROVIDER_SITE_OTHER): Payer: Managed Care, Other (non HMO) | Admitting: Family Medicine

## 2015-06-15 ENCOUNTER — Encounter: Payer: Self-pay | Admitting: Family Medicine

## 2015-06-15 VITALS — BP 120/80 | HR 64 | Temp 97.4°F | Wt 169.6 lb

## 2015-06-15 DIAGNOSIS — R059 Cough, unspecified: Secondary | ICD-10-CM

## 2015-06-15 DIAGNOSIS — J029 Acute pharyngitis, unspecified: Secondary | ICD-10-CM

## 2015-06-15 DIAGNOSIS — R05 Cough: Secondary | ICD-10-CM | POA: Diagnosis not present

## 2015-06-15 DIAGNOSIS — J069 Acute upper respiratory infection, unspecified: Secondary | ICD-10-CM

## 2015-06-15 NOTE — Patient Instructions (Signed)
Stay well hydrated, try saltwater gargles for your throat discomfort, take Tylenol or ibuprofen for fever, headache, body aches. Mucinex DM or Robitussin-DM for congestion and cough. Let us know if you're not improving in the next 2-3 days.  Upper Respiratory Infection, Adult Most upper respiratory infections (URIs) are a viral infection of the air passages leading to the lungs. A URI affects the nose, throat, and upper air passages. The most common type of URI is nasopharyngitis and is typically referred to as "the common cold." URIs run their course and usually go away on their own. Most of the time, a URI does not require medical attention, but sometimes a bacterial infection in the upper airways can follow a viral infection. This is called a secondary infection. Sinus and middle ear infections are common types of secondary upper respiratory infections. Bacterial pneumonia can also complicate a URI. A URI can worsen asthma and chronic obstructive pulmonary disease (COPD). Sometimes, these complications can require emergency medical care and may be life threatening.  CAUSES Almost all URIs are caused by viruses. A virus is a type of germ and can spread from one person to another.  RISKS FACTORS You may be at risk for a URI if:   You smoke.   You have chronic heart or lung disease.  You have a weakened defense (immune) system.   You are very young or very old.   You have nasal allergies or asthma.  You work in crowded or poorly ventilated areas.  You work in health care facilities or schools. SIGNS AND SYMPTOMS  Symptoms typically develop 2-3 days after you come in contact with a cold virus. Most viral URIs last 7-10 days. However, viral URIs from the influenza virus (flu virus) can last 14-18 days and are typically more severe. Symptoms may include:   Runny or stuffy (congested) nose.   Sneezing.   Cough.   Sore throat.   Headache.   Fatigue.   Fever.   Loss of  appetite.   Pain in your forehead, behind your eyes, and over your cheekbones (sinus pain).  Muscle aches.  DIAGNOSIS  Your health care provider may diagnose a URI by:  Physical exam.  Tests to check that your symptoms are not due to another condition such as:  Strep throat.  Sinusitis.  Pneumonia.  Asthma. TREATMENT  A URI goes away on its own with time. It cannot be cured with medicines, but medicines may be prescribed or recommended to relieve symptoms. Medicines may help:  Reduce your fever.  Reduce your cough.  Relieve nasal congestion. HOME CARE INSTRUCTIONS   Take medicines only as directed by your health care provider.   Gargle warm saltwater or take cough drops to comfort your throat as directed by your health care provider.  Use a warm mist humidifier or inhale steam from a shower to increase air moisture. This may make it easier to breathe.  Drink enough fluid to keep your urine clear or pale yellow.   Eat soups and other clear broths and maintain good nutrition.   Rest as needed.   Return to work when your temperature has returned to normal or as your health care provider advises. You may need to stay home longer to avoid infecting others. You can also use a face mask and careful hand washing to prevent spread of the virus.  Increase the usage of your inhaler if you have asthma.   Do not use any tobacco products, including cigarettes, chewing tobacco, or  electronic cigarettes. If you need help quitting, ask your health care provider. PREVENTION  The best way to protect yourself from getting a cold is to practice good hygiene.   Avoid oral or hand contact with people with cold symptoms.   Wash your hands often if contact occurs.  There is no clear evidence that vitamin C, vitamin E, echinacea, or exercise reduces the chance of developing a cold. However, it is always recommended to get plenty of rest, exercise, and practice good nutrition.    SEEK MEDICAL CARE IF:   You are getting worse rather than better.   Your symptoms are not controlled by medicine.   You have chills.  You have worsening shortness of breath.  You have brown or red mucus.  You have yellow or brown nasal discharge.  You have pain in your face, especially when you bend forward.  You have a fever.  You have swollen neck glands.  You have pain while swallowing.  You have white areas in the back of your throat. SEEK IMMEDIATE MEDICAL CARE IF:   You have severe or persistent:  Headache.  Ear pain.  Sinus pain.  Chest pain.  You have chronic lung disease and any of the following:  Wheezing.  Prolonged cough.  Coughing up blood.  A change in your usual mucus.  You have a stiff neck.  You have changes in your:  Vision.  Hearing.  Thinking.  Mood. MAKE SURE YOU:   Understand these instructions.  Will watch your condition.  Will get help right away if you are not doing well or get worse.   This information is not intended to replace advice given to you by your health care provider. Make sure you discuss any questions you have with your health care provider.   Document Released: 10/15/2000 Document Revised: 09/05/2014 Document Reviewed: 07/27/2013 Elsevier Interactive Patient Education [employer]

## 2015-06-15 NOTE — Progress Notes (Signed)
Subjective:  Eric Nash is a 25 y.o. male who presents for evaluation of sore throat.  He has not had a recent close exposure to someone with proven streptococcal pharyngitis.  Reports 2 day history of symptoms that started with sore throat then last night he started having runny nose, headache, cough fatigue. Denies fever, ear ache, nausea, vomiting, diarrhea. Took antibiotics in January for wisdom tooth removal.   Treatment to date: none.  Unknown sick contacts.  No other aggravating or relieving factors.  No other c/o.  The following portions of the patient's history were reviewed and updated as appropriate: allergies, current medications, past medical history, past social history, past surgical history and problem list.  ROS as in subjective   Objective: Filed Vitals:   06/15/15 1120  BP: 120/80  Pulse: 64  Temp: 97.4 F (36.3 C)    General appearance: no distress, WD/WN, is not ill-appearing HEENT: normocephalic, conjunctiva/corneas normal, sclerae anicteric, nares patent, clear discharge and mild erythema, pharynx with mild erythema, without exudate.  Oral cavity: MMM, no lesions  Neck: supple, no lymphadenopathy, no thyromegaly Heart: RRR, normal S1, S2, no murmurs Lungs: CTA bilaterally, no wheezes, rhonchi, or rales   Laboratory Strep test not done.   Assessment and Plan: Acute pharyngitis, unspecified etiology  Cough  Acute upper respiratory infection   Advised that symptoms and exam suggest a viral etiology.  Discussed symptomatic treatment including salt water gargles, warm fluids, rest, hydrate well, can use over-the-counter Tylenol for throat pain, fever, or malaise. Try Mucinex DM or Robitussin-DM for cough.  If worse or not improving within 2-3 days, call or return.

## 2016-03-13 ENCOUNTER — Emergency Department (HOSPITAL_COMMUNITY): Payer: Managed Care, Other (non HMO)

## 2016-03-13 ENCOUNTER — Encounter (HOSPITAL_COMMUNITY): Payer: Self-pay | Admitting: *Deleted

## 2016-03-13 ENCOUNTER — Emergency Department (HOSPITAL_COMMUNITY)
Admission: EM | Admit: 2016-03-13 | Discharge: 2016-03-13 | Disposition: A | Discharge status: Home / Self Care | Payer: Managed Care, Other (non HMO) | Attending: Physician Assistant | Admitting: Physician Assistant

## 2016-03-13 DIAGNOSIS — L03115 Cellulitis of right lower limb: Secondary | ICD-10-CM | POA: Diagnosis not present

## 2016-03-13 MED ORDER — ONDANSETRON HCL 4 MG PO TABS
4.0000 mg | ORAL_TABLET | Freq: Once | ORAL | Status: AC
  Administered 2016-03-13: 4 mg via ORAL
  Filled 2016-03-13: qty 1

## 2016-03-13 MED ORDER — SULFAMETHOXAZOLE-TRIMETHOPRIM 800-160 MG PO TABS
1.0000 | ORAL_TABLET | Freq: Once | ORAL | Status: AC
  Administered 2016-03-13: 1 via ORAL
  Filled 2016-03-13: qty 1

## 2016-03-13 MED ORDER — SULFAMETHOXAZOLE-TRIMETHOPRIM 800-160 MG PO TABS: 1.0000 | ORAL_TABLET | Freq: Two times a day (BID) | ORAL | 0 refills | Status: AC

## 2016-03-13 MED ORDER — CLOTRIMAZOLE 1 % EX CREA
TOPICAL_CREAM | Freq: Two times a day (BID) | CUTANEOUS | Status: DC
  Administered 2016-03-13: 14:00:00 via TOPICAL
  Filled 2016-03-13: qty 15

## 2016-03-13 MED ORDER — ONDANSETRON HCL 4 MG PO TABS: 4.0000 mg | ORAL_TABLET | Freq: Three times a day (TID) | ORAL | 0 refills | Status: AC | PRN

## 2016-03-13 NOTE — Discharge Instructions (Signed)
If you've any increasing redness, swelling, fevers, worsening symptoms please return immediately to the emergency department.

## 2016-03-13 NOTE — ED Triage Notes (Signed)
[facility] on the bottom of his foot. Pt noticed the blister looked infected at 3AM today.  Pt states he felt chills this morning, had temperature of 100.3. Pt took tylenol at 1030 this morning.

## 2016-03-13 NOTE — ED Provider Notes (Signed)
[facility]-EMERGENCY DEPT Provider Note   CSN: [id number] Arrival date & time: 03/13/16  1120     History   Chief Complaint Chief Complaint  Patient presents with  . Wound Infection    HPI Eric Nash is a 25 y.o. male.  HPI  Pt is a 25 yo male presenting with questionable cellulitis of the foot. Patient reports that he had a small blister from althetics and then woke up this morning with redness and pain to the foot.   On arrival to ED. I cannot initially visualize any redness. Patient's coach/trainer at bedside. He points to an area on his foot. I've taken picture for the chart.  Patient had no systemic symptoms. No nausea vomiting diarrhea. No fevers.  No injury  Past Medical History:  Diagnosis Date  . Medical history non-contributory     There are no active problems to display for this patient.   Past Surgical History:  Procedure Laterality Date  . ANTERIOR CRUCIATE LIGAMENT REPAIR Right 05/22/2014   Procedure: RECONSTRUCTION ANTERIOR CRUCIATE LIGAMENT  WITH HAMSTRING GRAFT;  Surgeon: Cammy CopaGregory Scott Dean, MD;  [facility];  Service: Orthopedics;  Laterality: Right;  . NO PAST SURGERIES         Home Medications    Prior to Admission medications   Medication Sig Start Date End Date Taking? Authorizing Provider  acetaminophen (TYLENOL) 325 MG tablet Take 975 mg by mouth every 6 (six) hours as needed for mild pain, moderate pain, fever or headache.    Yes Historical Provider, MD  ibuprofen (ADVIL,MOTRIN) 200 MG tablet Take 400 mg by mouth 2 (two) times daily as needed for fever, headache, mild pain, moderate pain or cramping.   Yes Historical Provider, MD    Family History Family History  Problem Relation Age of Onset  . Cancer Paternal Grandfather   . Diabetes Paternal Grandfather   . Heart disease Maternal Grandfather     Social History Social History  Substance Use Topics  . Smoking status: Never Smoker  . Smokeless tobacco: Not on file  . Alcohol  use Yes     Comment: occ     Allergies   Percocet [oxycodone-acetaminophen]   Review of Systems Review of Systems  Constitutional: Negative for fatigue and fever.  Gastrointestinal: Negative for nausea and vomiting.     Physical Exam Updated Vital Signs BP 127/63 (BP Location: Right Arm)   Pulse 103   Temp 98.6 F (37 C) (Oral)   Resp 18   Wt 165 lb (74.8 kg)   SpO2 98%   BMI 25.09 kg/m   Physical Exam  Constitutional: He is oriented to person, place, and time. He appears well-nourished.  HENT:  Head: Normocephalic.  Eyes: Conjunctivae are normal.  Cardiovascular: Normal rate.   Pulmonary/Chest: Effort normal.  Musculoskeletal:  Right foot as seen in picture. Mild erythema not well demarcated. Full range of motion. No evidence of excoriations.  Neurological: He is oriented to person, place, and time.  Skin: Skin is warm and dry. He is not diaphoretic.  Psychiatric: He has a normal mood and affect. His behavior is normal.     ED Treatments / Results  Labs (all labs ordered are listed, but only abnormal results are displayed) Labs Reviewed - No data to display  EKG  EKG Interpretation None       Radiology Dg Foot Complete Right  Result Date: 03/13/2016 CLINICAL DATA:  Patient had a blister on the bottom of the right foot on the ball  of the foot which popped and patient now has diffuse right foot pain, swelling and redness which started this morning; r/o gas in tissue EXAM: RIGHT FOOT COMPLETE - 3+ VIEW COMPARISON:  None. FINDINGS: No fracture. No bone lesion. There are no areas of bone resorption to suggest osteomyelitis. Joints are normally spaced and aligned.  No arthropathic change. Soft tissues are unremarkable.  No soft tissue air. IMPRESSION: Negative. Electronically Signed   By: Amie Portlandavid  Ormond M.D.   On: 03/13/2016 14:19    Procedures Procedures (including critical care time)  Medications Ordered in ED Medications  clotrimazole (LOTRIMIN) 1 %  cream ( Topical Given 03/13/16 1425)  sulfamethoxazole-trimethoprim (BACTRIM DS,SEPTRA DS) 800-160 MG per tablet 1 tablet (1 tablet Oral Given 03/13/16 1424)  ondansetron (ZOFRAN) tablet 4 mg (4 mg Oral Given 03/13/16 1424)     Initial Impression / Assessment and Plan / ED Course  I have reviewed the triage vital signs and the nursing notes.  Pertinent labs & imaging results that were available during my care of the patient were reviewed by me and considered in my medical decision making (see chart for details).  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 Patient has tournement this weekend. Patient seen trainer and brought here to the emergency department. Patient is a subtle erythema to the right foot. Good pulses. No systemic signs of infection. We will treat with outpatient antibiotic and have patient follow-up on Monday with physician. We told both the patient and the trainer's symptoms to watch out for. Return precautions expressed and understood.   Patient is comfortable, ambulatory, and taking PO at time of discharge.  Patient expressed understanding about return precautions.        Final Clinical Impressions(s) / ED Diagnoses   Final diagnoses:  None    New Prescriptions New Prescriptions   No medications on file     Kerrigan Gombos Randall AnLyn Breland Elders, MD 03/17/16 2005

## 2016-04-07 ENCOUNTER — Ambulatory Visit (INDEPENDENT_AMBULATORY_CARE_PROVIDER_SITE_OTHER): Payer: Managed Care, Other (non HMO) | Admitting: Family Medicine

## 2016-04-07 ENCOUNTER — Encounter: Payer: Self-pay | Admitting: Family Medicine

## 2016-04-07 VITALS — BP 130/60 | HR 54 | Temp 98.3°F | Resp 18 | Wt 164.8 lb

## 2016-04-07 DIAGNOSIS — J069 Acute upper respiratory infection, unspecified: Secondary | ICD-10-CM | POA: Diagnosis not present

## 2016-04-07 DIAGNOSIS — J019 Acute sinusitis, unspecified: Secondary | ICD-10-CM | POA: Diagnosis not present

## 2016-04-07 MED ORDER — AMOXICILLIN 875 MG PO TABS: 875.0000 mg | ORAL_TABLET | Freq: Two times a day (BID) | ORAL | 0 refills | Status: AC

## 2016-04-07 NOTE — Progress Notes (Signed)
Subjective:  Eric Nash is a 25 y.o. male who presents for possible sinus infection.  Symptoms include a 3 week history of rhinorrhea, nasal congestion, sinus pressure, purulent nasal drainage, ear popping, and cough.  Denies fever,chills, chest pain, palpitations, wheezing, shortness of breath, abdominal pain, N/V/D.  States he had a foot infection in mid November and took Abx for this and states he got better.  Past history is significant for bronchitis. No pneumonia or asthma. He is a runner and healthy.  Patient is a non-smoker.  Using nothing for symptoms.  Denies sick contacts.  No other aggravating or relieving factors.  No other c/o.  ROS as in subjective   Objective: Vitals:   04/07/16 1150  BP: 130/60  Pulse: (!) 54  Resp: 18  Temp: 98.3 F (36.8 C)    General appearance: Alert, WD/WN, no distress                             Skin: warm, no rash                           Head: + maxillary sinus tenderness,                            Eyes: conjunctiva normal, corneas clear, PERRLA                            Ears: pearly TMs, external ear canals normal                          Nose: septum midline, turbinates swollen, with erythema and clear discharge             Mouth/throat: MMM, tongue normal, mild pharyngeal erythema                           Neck: supple, no adenopathy, no thyromegaly, nontender                          Heart: RRR, normal S1, S2, no murmurs                         Lungs: CTA bilaterally, no wheezes, rales, or rhonchi       Assessment and Plan: Acute URI  Acute sinusitis with symptoms > 10 days - Plan: amoxicillin (AMOXIL) 875 MG tablet   Prescription given for Amoxil.  Can use OTC Mucinex for congestion.  Tylenol or Ibuprofen OTC for fever and malaise.  Discussed symptomatic relief, nasal saline flush, and call or return if not back to baseline after finishing the antibiotic.

## 2018-01-22 IMAGING — CR DG FOOT COMPLETE 3+V*R*
3 series · 3 of 3 positions shown · non-contrast
Comparison: None.

CLINICAL DATA: Patient had a blister on the bottom of the right
foot on the ball of the foot which popped and patient now has
diffuse right foot pain, swelling and redness which started this
morning; r/o gas in tissue

EXAM:
RIGHT FOOT COMPLETE - 3+ VIEW

[x foot ap right]
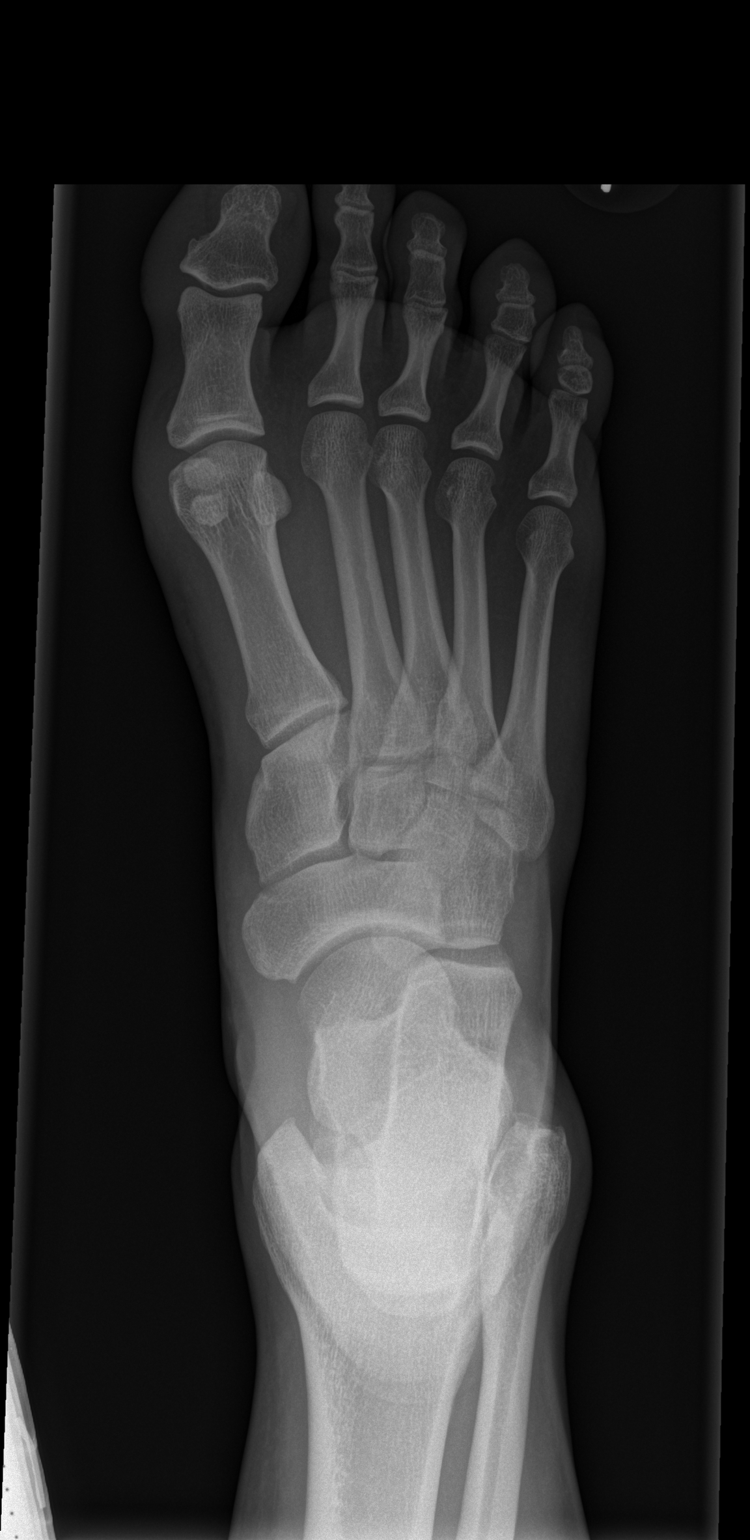

[x foot obl right]
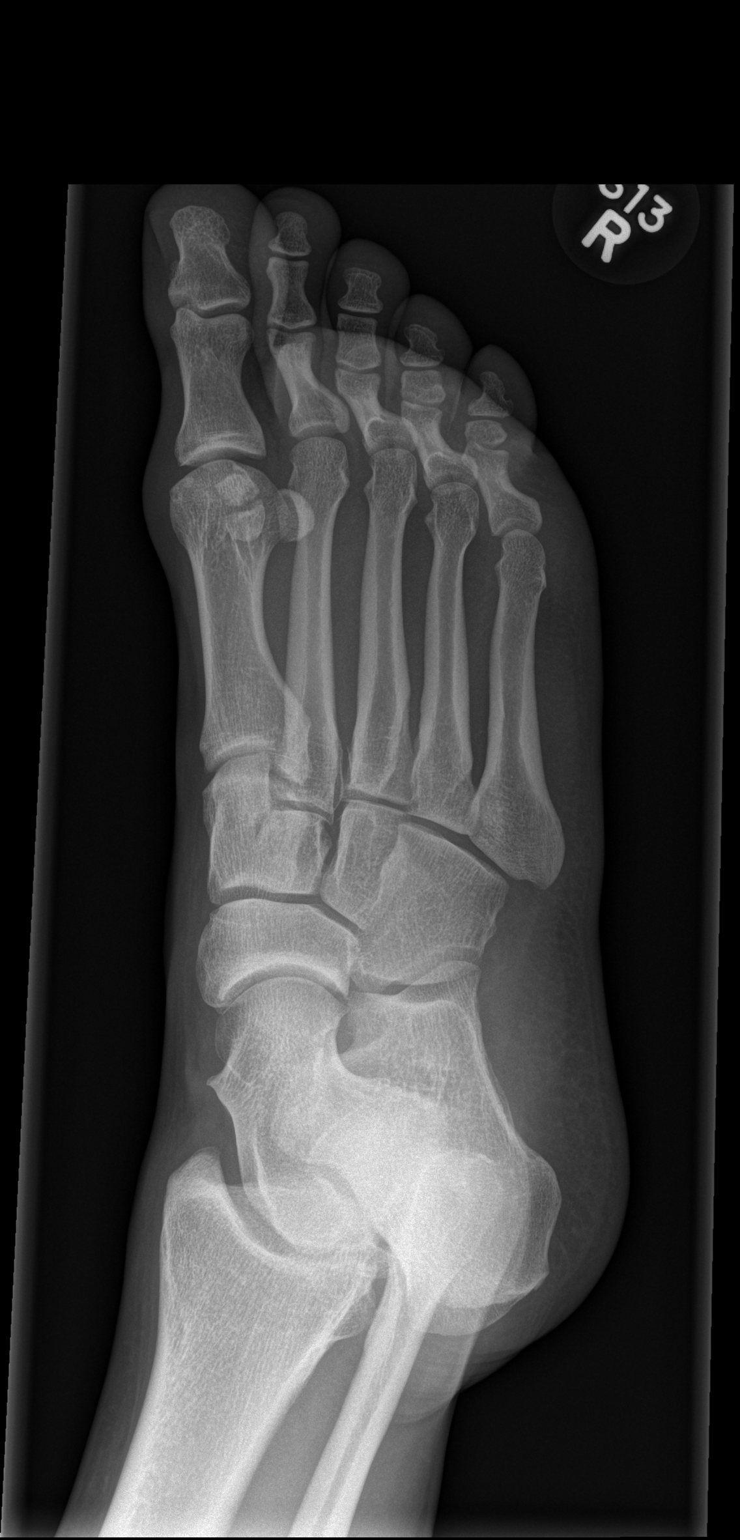

[x foot lat right]
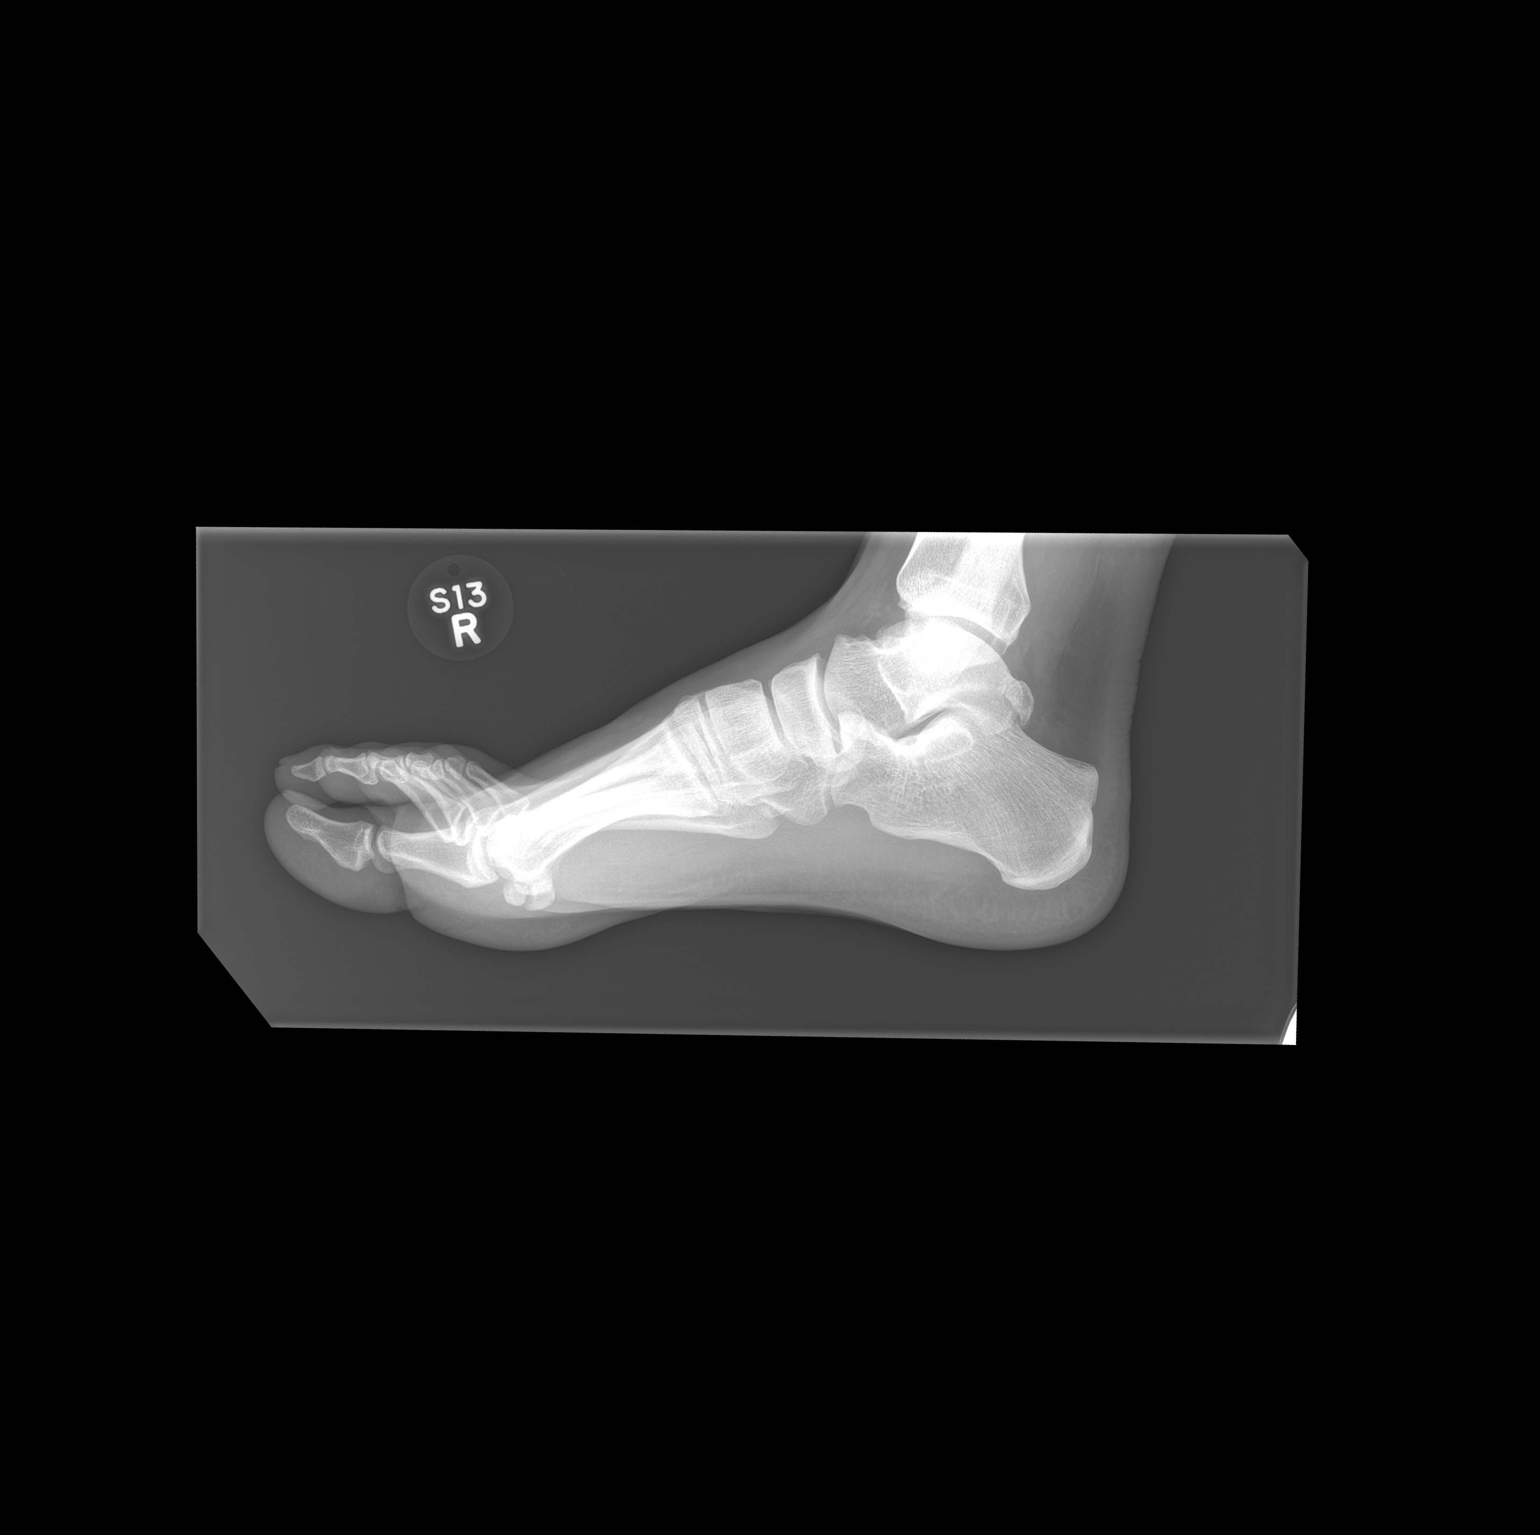

[3 of 3 positions shown; findings below may reference images not displayed]

FINDINGS: No fracture. No bone lesion. There are no areas of bone resorption
to suggest osteomyelitis.

Joints are normally spaced and aligned.  No arthropathic change.

Soft tissues are unremarkable.  No soft tissue air.
IMPRESSION: Negative.
